# Patient Record
Sex: Female | Born: 1980 | Race: White | Hispanic: No | Marital: Married | Smoking: Never smoker
Health system: Southern US, Community
[De-identification: ages and names within clinical notes are randomized; demographics above are authoritative.]

## PROBLEM LIST (undated history)

## (undated) ENCOUNTER — Inpatient Hospital Stay (HOSPITAL_COMMUNITY): Payer: Self-pay

## (undated) DIAGNOSIS — F329 Major depressive disorder, single episode, unspecified: Secondary | ICD-10-CM

## (undated) DIAGNOSIS — B019 Varicella without complication: Secondary | ICD-10-CM

## (undated) DIAGNOSIS — F32A Depression, unspecified: Secondary | ICD-10-CM

## (undated) DIAGNOSIS — N12 Tubulo-interstitial nephritis, not specified as acute or chronic: Secondary | ICD-10-CM

## (undated) DIAGNOSIS — L309 Dermatitis, unspecified: Secondary | ICD-10-CM

## (undated) DIAGNOSIS — O139 Gestational [pregnancy-induced] hypertension without significant proteinuria, unspecified trimester: Secondary | ICD-10-CM

## (undated) HISTORY — DX: Major depressive disorder, single episode, unspecified: F32.9

## (undated) HISTORY — DX: Gestational (pregnancy-induced) hypertension without significant proteinuria, unspecified trimester: O13.9

## (undated) HISTORY — DX: Varicella without complication: B01.9

## (undated) HISTORY — PX: DILATION AND CURETTAGE OF UTERUS: SHX78

## (undated) HISTORY — DX: Tubulo-interstitial nephritis, not specified as acute or chronic: N12

## (undated) HISTORY — PX: TONSILLECTOMY: SUR1361

## (undated) HISTORY — DX: Dermatitis, unspecified: L30.9

## (undated) HISTORY — DX: Depression, unspecified: F32.A

---
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
## 2000-05-11 ENCOUNTER — Other Ambulatory Visit: Admission: RE | Admit: 2000-05-11 | Discharge: 2000-05-11 | Payer: Self-pay | Admitting: Obstetrics and Gynecology

## 2001-04-27 ENCOUNTER — Other Ambulatory Visit: Admission: RE | Admit: 2001-04-27 | Discharge: 2001-04-27 | Payer: Self-pay | Admitting: Obstetrics and Gynecology

## 2001-12-28 ENCOUNTER — Encounter: Payer: Self-pay | Admitting: Obstetrics and Gynecology

## 2001-12-28 ENCOUNTER — Ambulatory Visit (HOSPITAL_COMMUNITY): Admission: RE | Admit: 2001-12-28 | Discharge: 2001-12-28 | Payer: Self-pay | Admitting: Obstetrics and Gynecology

## 2002-01-28 ENCOUNTER — Inpatient Hospital Stay (HOSPITAL_COMMUNITY): Admission: AD | Admit: 2002-01-28 | Discharge: 2002-01-28 | Payer: Self-pay | Admitting: Obstetrics and Gynecology

## 2002-02-08 ENCOUNTER — Inpatient Hospital Stay (HOSPITAL_COMMUNITY): Admission: AD | Admit: 2002-02-08 | Discharge: 2002-02-10 | Payer: Self-pay | Admitting: Obstetrics and Gynecology

## 2002-02-16 ENCOUNTER — Encounter: Admission: RE | Admit: 2002-02-16 | Discharge: 2002-03-18 | Payer: Self-pay | Admitting: Obstetrics and Gynecology

## 2002-05-03 ENCOUNTER — Other Ambulatory Visit: Admission: RE | Admit: 2002-05-03 | Discharge: 2002-05-03 | Payer: Self-pay | Admitting: Obstetrics and Gynecology

## 2003-07-07 ENCOUNTER — Emergency Department (HOSPITAL_COMMUNITY): Admission: AC | Admit: 2003-07-07 | Discharge: 2003-07-07 | Payer: Self-pay

## 2003-11-21 ENCOUNTER — Other Ambulatory Visit: Admission: RE | Admit: 2003-11-21 | Discharge: 2003-11-21 | Payer: Self-pay | Admitting: Obstetrics and Gynecology

## 2004-09-26 ENCOUNTER — Inpatient Hospital Stay (HOSPITAL_COMMUNITY): Admission: AD | Admit: 2004-09-26 | Discharge: 2004-09-26 | Payer: Self-pay | Admitting: Obstetrics and Gynecology

## 2004-10-03 ENCOUNTER — Ambulatory Visit (HOSPITAL_COMMUNITY): Admission: RE | Admit: 2004-10-03 | Discharge: 2004-10-03 | Payer: Self-pay | Admitting: Obstetrics and Gynecology

## 2004-10-11 ENCOUNTER — Ambulatory Visit (HOSPITAL_COMMUNITY): Admission: RE | Admit: 2004-10-11 | Discharge: 2004-10-11 | Payer: Self-pay | Admitting: Obstetrics and Gynecology

## 2004-10-25 ENCOUNTER — Encounter (INDEPENDENT_AMBULATORY_CARE_PROVIDER_SITE_OTHER): Payer: Self-pay | Admitting: *Deleted

## 2004-10-25 ENCOUNTER — Ambulatory Visit (HOSPITAL_COMMUNITY): Admission: RE | Admit: 2004-10-25 | Discharge: 2004-10-25 | Payer: Self-pay | Admitting: Obstetrics and Gynecology

## 2005-03-14 ENCOUNTER — Inpatient Hospital Stay (HOSPITAL_COMMUNITY): Admission: AD | Admit: 2005-03-14 | Discharge: 2005-03-16 | Payer: Self-pay | Admitting: Obstetrics and Gynecology

## 2005-03-26 ENCOUNTER — Other Ambulatory Visit: Admission: RE | Admit: 2005-03-26 | Discharge: 2005-03-26 | Payer: Self-pay | Admitting: Obstetrics and Gynecology

## 2005-05-02 ENCOUNTER — Other Ambulatory Visit: Admission: RE | Admit: 2005-05-02 | Discharge: 2005-05-02 | Payer: Self-pay | Admitting: Obstetrics and Gynecology

## 2005-08-06 ENCOUNTER — Other Ambulatory Visit: Admission: RE | Admit: 2005-08-06 | Discharge: 2005-08-06 | Payer: Self-pay | Admitting: Obstetrics and Gynecology

## 2005-11-06 ENCOUNTER — Other Ambulatory Visit: Admission: RE | Admit: 2005-11-06 | Discharge: 2005-11-06 | Payer: Self-pay | Admitting: Obstetrics and Gynecology

## 2006-01-08 ENCOUNTER — Inpatient Hospital Stay (HOSPITAL_COMMUNITY): Admission: AD | Admit: 2006-01-08 | Discharge: 2006-01-08 | Payer: Self-pay | Admitting: Obstetrics and Gynecology

## 2006-01-15 ENCOUNTER — Inpatient Hospital Stay (HOSPITAL_COMMUNITY): Admission: AD | Admit: 2006-01-15 | Discharge: 2006-01-15 | Payer: Self-pay | Admitting: Obstetrics and Gynecology

## 2006-01-16 ENCOUNTER — Ambulatory Visit (HOSPITAL_COMMUNITY): Admission: RE | Admit: 2006-01-16 | Discharge: 2006-01-16 | Payer: Self-pay | Admitting: Obstetrics and Gynecology

## 2006-01-16 ENCOUNTER — Encounter (INDEPENDENT_AMBULATORY_CARE_PROVIDER_SITE_OTHER): Payer: Self-pay | Admitting: Specialist

## 2006-03-26 ENCOUNTER — Other Ambulatory Visit: Admission: RE | Admit: 2006-03-26 | Discharge: 2006-03-26 | Payer: Self-pay | Admitting: Obstetrics and Gynecology

## 2008-01-28 ENCOUNTER — Inpatient Hospital Stay (HOSPITAL_COMMUNITY): Admission: AD | Admit: 2008-01-28 | Discharge: 2008-01-28 | Payer: Self-pay | Admitting: Obstetrics and Gynecology

## 2008-05-01 ENCOUNTER — Inpatient Hospital Stay (HOSPITAL_COMMUNITY): Admission: AD | Admit: 2008-05-01 | Discharge: 2008-05-01 | Payer: Self-pay | Admitting: Obstetrics and Gynecology

## 2008-07-24 ENCOUNTER — Inpatient Hospital Stay (HOSPITAL_COMMUNITY): Admission: RE | Admit: 2008-07-24 | Discharge: 2008-07-26 | Payer: Self-pay | Admitting: Obstetrics and Gynecology

## 2010-12-31 NOTE — H&P (Signed)
NAMEYUKI, Chapman                 ACCOUNT NO.:  [id number]   MEDICAL RECORD NO.:  [id number]          PATIENT TYPE:  INP   LOCATION:  9168                          FACILITY:  [facility]   PHYSICIAN:  Crist Fat. Rivard, M.D. DATE OF BIRTH:  01/31/1981   DATE OF ADMISSION:  07/24/2008  DATE OF DISCHARGE:                              HISTORY & PHYSICAL   [name] is a 30 year old gravida 8, para 2-0-5-2 at 40 weeks who  presents for induction secondary to term with favorable cervix and  history of rapid labor.  Cervix has been 2-3 cm in the office.  Group B  strep culture has been negative.   Pregnancy has been remarkable for:  1. Five SABs since 2005.  Last viable pregnancy was in 2003.  2. Rh negative.  The patient receiving RhoGAM during this pregnancy.  3. History of PIH with first pregnancy.  4. History of pyelonephritis.   PRENATAL LABORATORIES:  Blood type is O negative, Rh antibody negative,  VDRL nonreactive, rubella titer positive, hepatitis B surface antigen  negative, HIV is nonreactive.  GC and Chlamydia cultures were negative  in April.  Hemoglobin upon entering the practice was 12.8.  It was 11.9  at 28 weeks.  Pap was done at 23 weeks showing ASCUS but no high-risk  HPV with the plan to repeat in 1 year.  First trimester screening was  normal.  Glucola was normal.  Group B strep culture was negative at 36  weeks.   HISTORY OF PRESENT PREGNANCY:  The patient entered care at approximately  7 weeks.  She had an ultrasound at that time for dating which gave an  [facility] July [facility], [facility].  She had had a report of some first trimester  spotting.  No evidence of this was seen.  Antibody screen was negative  from 04/24.  First trimester screen was normal.  The patient had an  ultrasound at 18 weeks showing normal growth.  Placenta was anterior.  Pap was done at 23 weeks showing ASCUS on Pap but no high-risk HPV with  a plan made to repeat in 1 year.  She was given RhoGAM at 27  weeks.  Glucola was 133.  RPR was nonreactive.  Hemoglobin at that time was  11.9.  She had a negative fetal echo with Dr. Elizebeth Brooking.  She declined H1  N1.  She had another ultrasound at 35 weeks showing growth at the 80th  percentile and fluid at the 75th percentile.  Group B strep culture was  done at that time and was negative.  The rest of her pregnancy was  essentially uncomplicated.  By 39 and 2, she was requesting induction  secondary to history of rapid labor.  Membranes were swept at that time,  and the patient reviewed the risk and benefits of elective induction  including failure of method, prolonged labor, need for cesarean section,  need for Pitocin augmentation.  She did wish to continue to proceed with  scheduled induction, in light of the favorableness of her cervix, and  she was scheduled for today.  OBSTETRICAL HISTORY:  In 2001, she had a vaginal birth of a female infant  weight, 8 pounds 9 ounces at 40 and 3/7 weeks.  She was in labor 16  hours.  She had epidural anesthesia.  She did have PIH with that  pregnancy but did not require medication.  In 2003, she had a vaginal  birth of a female infant, weight 8 pounds 4 ounces at 40 weeks'  gestation.  She was in labor 8 hours and had rapid progression to  delivery.  She had epidural anesthesia.  In that first pregnancy, she  had anemia, PIH.  She did receive RhoGAM with both of those pregnancies.  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  All of them were at 6-8 weeks of gestation,  and she received RhoGAM in the previous pregnancies.   MEDICAL HISTORY:  1. She has a previous abnormal Pap in 2006 and had a colposcopy.  All      Paps have been normal since that time.  2. She reports the usual childhood illnesses.  3. In 2001, she had anemia.  4. In 2006, she had a UTI and pyelonephritis.  5. She had a fractured ankle in 2006.   SURGICAL HISTORY:  1. D and E's x2  in 2006 and 2007.  2. Tonsillectomy at age 4.  3. Wisdom teeth at age 58.   ALLERGIES:  1. PENICILLIN which causes hives.  2. YASMIN which causes hives.   FAMILY HISTORY:  Paternal grandfather has hypertension.  Her mother has  anemia.  Her maternal grandmother has emphysema.  Her mother and  maternal grandmother are bipolar.   GENETIC HISTORY:  Remarkable for the patient's uncle's sister having a  hole in heart.  The patient's uncle has Down syndrome and also has a  cardiac defect.  The patient's sister also was born with a hole in the  heart.  Father of the baby's cousin had twins.   OTHER HOSPITALIZATIONS:  The patient's only other hospitalizations were  for childbirth, and in 2006 she was hospitalized for pyelonephritis.   SOCIAL HISTORY:  The patient is married to the father of baby.  He is  involved and supportive.  His name is Mertis Mosher.  The patient has 2  years of college.  She is employed in the [employer].  Her partner  is a stay-at-home dad.  She has been followed by the certified nurse  midwife service at [facility].  She denies any alcohol, drug or  tobacco use during this pregnancy.  She was a smoker in the past.   PHYSICAL EXAMINATION:  VITAL SIGNS:  Stable.  The patient is febrile.  HEENT:  Within normal limits.  LUNGS:  Breath sounds are clear.  HEART:  Regular rate and rhythm without murmur.  BREASTS:  Soft and nontender.  ABDOMEN:  Fundal height is approximately 40 cm.  Estimated fetal weight  is 8 to 8-1/2 pounds.  Uterine contractions are irregular and mild.  Cervix is 3 cm, 60%, vertex at a -2 station but well applied.  Membranes  are intact.  Fetal heart rate is reactive with no decelerations.  EXTREMITIES:  Deep tendon reflexes are 2+ without clonus.  There is  trace edema noted.   IMPRESSION:  1. Intrauterine pregnancy at 14 weeks.  2. Group B strep negative.  3. Requesting induction secondary to history of rapid labor.    PLAN:  1. Admit to birthing suite for consult with Dr. Estanislado Pandy as attending      physician.  2. Routine certified nurse midwife orders.  3. Risks and benefits of induction were reviewed with the patient and      family including failure of method, prolonged labor, need for      augmentation or cesarean section.  The patient and her husband seem      to understand these risks and wish to proceed.  4. The patient requests artificial rupture of membranes as initial      primary method of labor induction with augmentation with Pitocin if      no response to artificial rupture of membranes and no response also      to breast stimulation.  5. The patient desires pain medication at this time.  We will defer an      IV or saline lock per patient request.  She understands that if      emergent circumstances or need for pain medication arise, then an      IV will have to be started.      Renaldo Reel Emilee Hero, C.N.M.      Crist Fat Rivard, M.D.  Electronically Signed    VLL/MEDQ  D:  07/24/2008  T:  07/24/2008  Job:  [id number]

## 2011-01-03 NOTE — H&P (Signed)
Wendy Chapman, BETTES NO.:  [id number]   MEDICAL RECORD NO.:  [id number]          PATIENT TYPE:  MAT   LOCATION:  MATC                          [facility]ACILITY:  [facility]   PHYSICIAN:  Janine Limbo, M.D.DATE OF BIRTH:  07/12/1981   DATE OF ADMISSION:  01/15/2006  DATE OF DISCHARGE:                                HISTORY & PHYSICAL   Patient is scheduled for an outpatient day surgery tomorrow, January 16, 2006.  Dr. Estanislado Pandy will be performing the surgery.   This is a 30 year old gravida 5, para 2, 0-2-2 at [redacted] weeks gestation, who  presents with increased bleeding and probable spontaneous abortion.  Quantitative HCGs have been followed in the office and have been rising  inappropriately.  Patient has had bleeding for about a week, and ultrasound  done two days ago showed a four week gestational sac in the uterus.  She did  receive RhoGam on May 24th.  Bleeding increased today with some cramping.  Patient is requesting a D&C.   ALLERGIES:  PENICILLIN causes hives.   OB HISTORY:  Remarkable for two vaginal deliveries and two spontaneous  abortions.   PAST MEDICAL HISTORY:  Unremarkable except for history of HPV.   FAMILY HISTORY:  Noncontributory.   PAST SURGICAL HISTORY:  Remarkable for a D&E in 2006 and a tonsillectomy in  1994.   GENETIC HISTORY:  Negative.   SOCIAL HISTORY:  Patient is married.  Father of the baby is involved and  supportive.  She is a Scientist, product/process development.  She denies any tobacco, alcohol,  or drug use.   OBJECTIVE DATA:  VITAL SIGNS:  Stable.  Blood pressure 91/58, temperature  98.1.  HEENT:  Within normal limits.  NECK:  Thyroid not enlarged.  CHEST:  Clear to auscultation.  HEART:  Regular rate and rhythm.  ABDOMEN:  Gravid with a uterus at 4-5 weeks size.  PELVIC:  Speculum exam shows 1 cm dilated os with moderate blood in the  vaginal vault.  Pelvic shows the uterus 4-5 weeks in size, nontender, with  no masses in the adnexa.  EXTREMITIES:  Within normal limits.   Quantitative HCG today 665.  Forty-eight hours ago, it was 698.  CBC shows a  white blood cell count of 9.6, hemoglobin 13, platelets 281.   Ultrasound done tonight shows no intrauterine gestational sac and represents  a probable spontaneous abortion.   ASSESSMENT:  1.  Intrauterine pregnancy at seven weeks gestation.  2.  Spontaneous abortion with moderate bleeding.   PLAN:  Patient was informed of results, and options were discussed,  including expectant management versus dilatation and curettage.  Patient  strongly requests D&C to minimize bleeding and shorten her recovery time.  This was discussed with Dr. Stefano Gaul, and [facility] was scheduled for June 1 at 12  noon.  Patient will be n.p.o. past midnight and presents for surgery with  Dr. Estanislado Pandy tomorrow.      Marie L. Williams, C.N.M.      Janine Limbo, M.D.  Electronically Signed    MLW/MEDQ  D:  01/15/2006  T:  01/15/2006  Job:  [id number]

## 2011-01-03 NOTE — Op Note (Signed)
Wendy Chapman, Wendy Chapman                 ACCOUNT NO.:  [id number]   MEDICAL RECORD NO.:  [id number]          PATIENT TYPE:  AMB   LOCATION:  SDC                           [facility]  [facility]   PHYSICIAN:  Dois Davenport A. Rivard, M.D. DATE OF BIRTH:  1981-05-26   DATE OF PROCEDURE:  10/25/2004  DATE OF DISCHARGE:                                 OPERATIVE REPORT   PREOPERATIVE DIAGNOSIS:  Missed abortion.   POSTOPERATIVE DIAGNOSIS:  Missed abortion.   ANESTHESIA:  IV sedation and paracervical block.   PROCEDURE:  Dilatation and evacuation.   SURGEON:  Crist Fat. Rivard, M.D.   ESTIMATED BLOOD LOSS:  Minimal.   PROCEDURE:  After being informed of the planned procedure with possible  complications, including bleeding, infection and uterine perforation,  informed consent was obtained.  The patient is taken to OR #8 and given IV  sedation.  She is placed in the lithotomy position, prepped and draped in a  sterile fashion, and her bladder is emptied with an in-and-out catheter.   A weighted speculum is inserted and the anterior lip of the cervix is  grasped with a tenaculum forceps.  The uterus is sounded at 10 cm, and the  cervix is easily dilated using Hegar dilator until #31.  Using a #9 curved  cannula, we then proceed with evacuation of the uterus, which retrieves  normal-appearing products of conception.   After evacuation, a sharp curette is used to assess the uterine cavity,  which is felt to be empty of tissue.   Instruments are then removed.  Instrument and sponge count is complete x2.  Estimated blood loss is minimal.  The procedure is well-tolerated by the  patient, who is taken to the recovery room in a well and stable condition.      SAR/MEDQ  D:  10/25/2004  T:  10/25/2004  Job:  [id number]

## 2011-01-03 NOTE — Discharge Summary (Signed)
NAMECYRAH, MCLAMB                 ACCOUNT NO.:  [id number]   MEDICAL RECORD NO.:  [id number]          PATIENT TYPE:  INP   LOCATION:  9307                          [facility]   PHYSICIAN:  Janine Limbo, M.D.DATE OF BIRTH:  11/14/1980   DATE OF ADMISSION:  03/14/2005  DATE OF DISCHARGE:  03/16/2005                                 DISCHARGE SUMMARY   DISCHARGE DIAGNOSIS:  Pyelonephritis.   PROCEDURES THIS ADMISSION:  None.   HISTORY OF PRESENT ILLNESS:  Ms. Margo Aye is a 30 year old female, para 2-0-1-2,  who presents complaining of low back pain for 3 days, fever, malaise, and  chills. Two weeks ago, she was treated for a urinary tract infection with  Macrodantin. She also had negative hepatitis B, negative HIV, negative RPR,  and negative herpes simplex virus test.   Please see history of present illness for details.   PHYSICAL EXAMINATION:  VITAL SIGNS:  Temperature is 100.2.  HEENT:  The patient appeared quite ill with shaking chills.  CHEST:  Clear.  HEART:  Regular rate and rhythm.  ABDOMEN:  Nontender.  BACK:  Right CVA tenderness.  PELVIC:  Within normal limits.   HOSPITAL COURSE:  The the patient reports being allergic to penicillin.  Therefore, she was started on IV gentamicin and ciprofloxacin by mouth. She  did have a maximum temperature in the hospital of 103.1. Laboratory testing  during the hospitalization included a negative pregnancy test, normal  chemistries, a hemoglobin of 13.3, white blood cell count of 10,200, blood  cultures that were negative to date, and a urine culture that showed greater  than 100,000 colonies of E-coli. The patient quickly felt better during her  hospitalization. By March 16, 2005, she was tolerating a regular diet and her  pain management was adequate with medication by mouth. She had been afebrile  for greater than 36 hours. The decision was made to discharge the patient to  home on p.o. medications.   DISCHARGE  MEDICATIONS:  1.  Vicodin one or two p.o. q.4 h p.r.n. pain.  2.  Ciprofloxacin 500 milligrams b.i.d. for 10 days.  3.  Ibuprofen 600 milligrams every 6 hours as needed for mild to moderate      pain.   DISCHARGE INSTRUCTIONS:  The patient will return to see Dr. Stefano Gaul in 2  weeks for repeat exam or sooner if necessary.       AVS/MEDQ  D:  03/16/2005  T:  03/17/2005  Job:  [id number]

## 2011-01-03 NOTE — Op Note (Signed)
Wendy Chapman, Wendy Chapman                 ACCOUNT NO.:  [id number]   MEDICAL RECORD NO.:  [id number]          PATIENT TYPE:  AMB   LOCATION:  SDC                           [facility]   PHYSICIAN:  Dois Davenport A. Rivard, M.D. DATE OF BIRTH:  09-08-1980   DATE OF PROCEDURE:  01/16/2006  DATE OF DISCHARGE:                                 OPERATIVE REPORT   PREOPERATIVE DIAGNOSIS:  Missed abortion.   POSTOPERATIVE DIAGNOSIS:  Missed abortion.   ANESTHESIA:  General and paracervical block.   PROCEDURE:  Dilation and curettage.   SURGEON:  Dr. Estanislado Pandy.   ESTIMATED BLOOD LOSS:  Minimal.   PROCEDURE:  After being informed of the planned procedure with possible  complications including bleeding, infection and injury to uterus, informed  consent is obtained.  The patient is taken to OR #4, given IV sedation and  placed in the lithotomy position.  She is prepped and draped in a sterile  fashion, and the bladder is emptied with an in-and-out Foley catheter.  GYN  exam reveals a patent cervix, a normal size anteverted uterus, two normal  adnexa.  A weighted speculum is inserted.  Anterior lip of the cervix is  grasped with a tenaculum forceps, and we proceed with a paracervical block  with 10 mL of Nesacaine 1% in the usual fashion.  The uterus is then sounded  at 7.5 cm, and the cervix easily allows a #33 Hegar dilator.  Using a sharp  curette, we curette the endometrial cavity which removes a large amount of  decidual tissue, but no products of conception are identified.  The patient  did come in with having passed some tissue which is examined for presence of  products of conception and appears to be fetal membranes after floating in  normal saline.   Instruments are removed.  Instruments and sponge count is complete x2.  Estimated blood loss is minimal.  The procedure is very well tolerated by  the patient who is taken to recovery room in a well and stable condition.  The patient will  follow-up in one week in the office for quantitative HCG,  and she is instructed to call if experiencing severe pain or severe  bleeding.  She is informed that we do not have confirmation of intrauterine  pregnancy, although clinically this appears to be the case, we still have  the possibility of an ectopic pregnancy, and she fully understands this.      Crist Fat Rivard, M.D.  Electronically Signed     SAR/MEDQ  D:  01/16/2006  T:  01/16/2006  Job:  [id number]

## 2011-01-03 NOTE — H&P (Signed)
[facility]  Patient:    Wendy Chapman, Wendy Chapman Visit Number: [id number] MRN: [id number]          Service Type: OBS Location: 910B [id number] 01 Attending Physician:  Jaymes Graff A Dictated by:   Nigel Bridgeman, C.N.M. Admit Date:  02/08/2002                           History and Physical  HISTORY OF PRESENT ILLNESS:   The patient is a 30 year old gravida 2, para 1-0-0-1 at [redacted] weeks gestation, who presents with uterine contractions every 3 minutes for the last several hours.  Cervix has been 4 cm in the office. She denied any bleeding or leaking and reports positive fetal movement.  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  #2 - History of irregular cycles.  #3 - First trimester spotting.  PRENATAL LABORATORY DATA:     Blood type is O-negative, Rh-antibody negative. VDRL nonreactive.  Rubella titer positive.  Hepatitis B surface antigen negative.  HIV nonreactive.  GC and Chlamydia cultures were negative.  Pap was normal.   Glucose challenge was normal.  Hemoglobin upon entry into practice was 13.2; it was 13.3 at 26 weeks.  AFP was declined.  Group B strep culture was negative at 36 weeks.  EDC of February 08, 2002 was established by last menstrual period and was in agreement with ultrasound at approximately 18 weeks.  HISTORY OF PRESENT PREGNANCY:  The patient entered care at approximately 9 weeks.  She declined RhoGAM secondary to status of Jehovahs Witness.  She had oral HSV noted at approximately 10 weeks, was given Zovirax ointment.  She had some cramping at 17 weeks which was treated with Motrin.  She had an ultrasound at 18 weeks which showed good growth and fluid.  She had some cramping at 33 weeks but cervix was closed.  Had an ultrasound at 33 weeks secondary to a family history of congenital heart defect, with no abnormal findings  noted.  She had another ultrasound at approximately 34 weeks with no cardiac anomalies noted, growth at the 75th to the 90th percentile.  The rest of her pregnancy was uncomplicated.  OBSTETRICAL HISTORY:          In January of 2001, she had a vaginal birth of a female infant, weight 8 pounds 9 ounces, at [redacted] weeks gestation; she was in labor 8 hours; she had epidural anesthesia.  She had elevated blood pressure during her labor.  She also declined RhoGAM during that pregnancy.  MEDICAL HISTORY:              She was on oral contraceptives and Lunelle in the past.  She has had one yeast infection in the past.  She reports the usual childhood illnesses.  PAST SURGICAL HISTORY:        Surgical history includes a tonsillectomy and adenoidectomy at age 75 and wisdom teeth removed at age 60.  Her only other hospitalization was for childbirth.  ALLERGIES:                    PENICILLIN causes a rash.  FAMILY HISTORY:               The patients maternal uncle had a heart murmur. Her sister had anemia.  Maternal grandmother had emphysema.  Maternal uncle had  some type of diabetes.  Maternal uncle had also some type of cancer. Maternal aunt and mother and maternal grandmother have depression and are bipolar.  Mother and maternal grandmother are victims of physical abuse. Grandmother is a smoker.  Mother was previously addicted to pain medications but is now in recovery.  GENETIC HISTORY:              Genetic history is remarkable for the father of the baby having an uncle with Downs syndrome.  The patients sister was born with a hole in her heart but did not require any treatment.  Her grandmother had a stillborn or early neonatal death.  SOCIAL HISTORY:               The patient is married to the father of the baby; he is involved and supportive; his name is Kellie Moor.  The patients is Caucasian.  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  Her husband is  high-school-educated; he is employed with the [employer].  She has been followed by the certified nurse midwife service at [facility].  She denies any alcohol, drug or tobacco use during this pregnancy.  PHYSICAL EXAMINATION:  VITAL SIGNS:                  Vital signs are stable.  The patient is afebrile.  HEENT:                        Within normal limits.  LUNGS:                        Bilateral breath sounds are clear.  HEART:                        Regular rate and rhythm without murmur.  BREASTS:                      Soft and nontender.  ABDOMEN:                      Fundal height is approximately 39 cm.  Estimated fetal weight is 8 to 8-1/2 pounds.  Uterine contractions are every 3 minutes, moderate quality.  Fetal heart rate is reassuring and reactive with no decelerations.  PELVIC:                       Cervical exam 4 to 5 cm, 75%, vertex at a -1 station with vertex well-applied.  EXTREMITIES:                  Deep tendon reflexes are 2+ without clonus. There is a trace edema noted.  IMPRESSION: 1. Intrauterine pregnancy at 40 weeks. 2. Early active labor. 3. Rh-negative but declines RhoGAM.  PLAN: 1. Admit to birthing suite per consult with Dr. Pierre Bali. Dillard as attending    physician. 2. Routine certified nurse midwife orders. 3. The patient declines epidural at present and requests deferral of IV at    this time. 4. Anticipate normal spontaneous vaginal birth. Dictated by:   Nigel Bridgeman, C.N.M. Attending Physician:  Michael Litter DD:  02/09/02 TD:  02/09/02 Job: [id number]

## 2011-01-03 NOTE — H&P (Signed)
Wendy Chapman, Wendy Chapman                 ACCOUNT NO.:  [id number]   MEDICAL RECORD NO.:  [id number]          PATIENT TYPE:  INP   LOCATION:  9307                          FACILITY:  [facility]   PHYSICIAN:  Janine Limbo, M.D.DATE OF BIRTH:  January 24, 1981   DATE OF ADMISSION:  03/14/2005  DATE OF DISCHARGE:                                HISTORY & PHYSICAL   HISTORY OF PRESENT ILLNESS:  [name] is a 30 year old female who presents  to the office complaining of low-back pain x3 days with temperature  elevation to 102+ at home without Tylenol. She complains of slight dysuria  as well as right flank pain, no frequency or urgency, some nausea but no  vomiting. She was seen at our office on February 24, 2005 and was treated for  urinary tract infection. She was given Macrobid which she completed her  course and urine culture showed greater than 100,000 colonies of E. coli  sensitive to Macrobid. The patient's symptoms have returned, thus causing  her to come back to the office for evaluation. The patient had negative  gonorrhea and chlamydia cultures on February 24, 2005. She also had negative  hepatitis B, HIV, RPR, and HSV-2 blood work on that same day.   PAST MEDICAL HISTORY:  She is allergic to PENICILLIN and YASMIN. The patient  has been taking NuvaRing for contraception.   OBSTETRICAL HISTORY:  She is a gravida 3 para 2. She had a vaginal delivery  in January 2001 as well as in June 2003, and then she had a missed AB  requiring D&E March 2006.   SOCIAL HISTORY:  She is separated at this time.   FAMILY MEDICAL HISTORY:  Noncontributory.   OBJECTIVE DATA:  VITAL SIGNS:  Temperature 100.2, blood pressure is 124/84,  other vital signs are stable.  HEENT:  Grossly within normal limits.  CHEST:  Clear to auscultation.  HEART:  Regular rate and rhythm.  ABDOMEN:  Nontender. No masses or organomegaly.  PELVIC:  Within normal limits.  EXTREMITIES:  Within normal limits.  BACK:  Shows positive right  CVA tenderness.  LABORATORY:  Her urinalysis is remarkable for trace leukocytes, positive  nitrites, 1+ protein, 3+ blood. Pregnancy test is negative.  EXTREMITIES:  Within normal limits.   ASSESSMENT:  Pyelonephritis.   PLAN:  1.  Admit to women's unit per consult with Dr. Stefano Gaul.  2.  Plan IV antibiotics and fluids for treatment.       KS/MEDQ  D:  03/14/2005  T:  03/14/2005  Job:  [id number]

## 2011-05-15 LAB — URINALYSIS, ROUTINE W REFLEX MICROSCOPIC
Bilirubin Urine: NEGATIVE
Glucose, UA: NEGATIVE
Nitrite: NEGATIVE
Urobilinogen, UA: 0.2
pH: 6

## 2011-05-15 LAB — WET PREP, GENITAL
Clue Cells Wet Prep HPF POC: NONE SEEN
Trich, Wet Prep: NONE SEEN
Yeast Wet Prep HPF POC: NONE SEEN

## 2011-05-21 LAB — RH IMMUNE GLOBULIN WORKUP (NOT WOMEN'S HOSP): ABO/RH(D): O NEG

## 2011-05-23 LAB — CBC
HCT: 30.1 % — ABNORMAL LOW (ref 36.0–46.0)
Hemoglobin: 10.3 g/dL — ABNORMAL LOW (ref 12.0–15.0)
MCHC: 34.3 g/dL (ref 30.0–36.0)
MCHC: 34.3 g/dL (ref 30.0–36.0)
Platelets: 280 10*3/uL (ref 150–400)
RBC: 3.44 MIL/uL — ABNORMAL LOW (ref 3.87–5.11)
RDW: 13.2 % (ref 11.5–15.5)
RDW: 13.5 % (ref 11.5–15.5)
WBC: 10.7 10*3/uL — ABNORMAL HIGH (ref 4.0–10.5)
WBC: 17.2 10*3/uL — ABNORMAL HIGH (ref 4.0–10.5)

## 2011-05-23 LAB — RH IMMUNE GLOB WKUP(>/=20WKS)(NOT WOMEN'S HOSP)

## 2013-08-18 NOTE — L&D Delivery Note (Addendum)
Delivery Note At 3:55 PM a viable female, "Wendy Chapman", was delivered via Vaginal, Spontaneous Delivery (Presentation: Right Occiput Anterior).  APGAR: 8, 9; weight .   Placenta status: Intact, Spontaneous, marginal insertion of cord.  Cord: 3 vessels with the following complications: CAN x 1, loose.  Cord pH: NA  Anesthesia: Epidural  Episiotomy: None Lacerations: 2nd degree;Perineal Suture Repair: 3.0 vicryl Est. Blood Loss (mL): 200  Mom to postpartum.  Baby to Couplet care / Skin to Skin. Family plans inpatient circumcision. Patient desires BTL--will maintain IV access and epidural cath.  Last ate at 11am today. Dr. Stefano Gaul notified, will determine time for BTL. R&B of tubal reviewed, including failure of method, risk of anesthesia, bleeding, infection, and damage to other organs. Patient still wishes to proceed with BTL.  Nigel Bridgeman 05/10/2014, 4:39 PM

## 2013-09-08 LAB — OB RESULTS CONSOLE RUBELLA ANTIBODY, IGM: Rubella: IMMUNE

## 2013-09-08 LAB — OB RESULTS CONSOLE HEPATITIS B SURFACE ANTIGEN: HEP B S AG: NEGATIVE

## 2013-09-08 LAB — OB RESULTS CONSOLE GC/CHLAMYDIA
Chlamydia: NEGATIVE
Gonorrhea: NEGATIVE

## 2013-09-08 LAB — OB RESULTS CONSOLE HIV ANTIBODY (ROUTINE TESTING): HIV: NONREACTIVE

## 2013-09-08 LAB — OB RESULTS CONSOLE RPR: RPR: NONREACTIVE

## 2014-04-07 LAB — OB RESULTS CONSOLE GBS: GBS: POSITIVE

## 2014-05-03 ENCOUNTER — Inpatient Hospital Stay (HOSPITAL_COMMUNITY)
Admission: AD | Admit: 2014-05-03 | Discharge: 2014-05-04 | DRG: 781 | Disposition: A | Discharge status: Home / Self Care | Payer: Managed Care, Other (non HMO) | Source: Ambulatory Visit | Attending: Obstetrics and Gynecology | Admitting: Obstetrics and Gynecology

## 2014-05-03 ENCOUNTER — Emergency Department (HOSPITAL_COMMUNITY): Payer: Managed Care, Other (non HMO)

## 2014-05-03 ENCOUNTER — Encounter (HOSPITAL_COMMUNITY): Payer: Self-pay | Admitting: Emergency Medicine

## 2014-05-03 DIAGNOSIS — S20219A Contusion of unspecified front wall of thorax, initial encounter: Secondary | ICD-10-CM | POA: Diagnosis present

## 2014-05-03 DIAGNOSIS — O4700 False labor before 37 completed weeks of gestation, unspecified trimester: Secondary | ICD-10-CM | POA: Diagnosis present

## 2014-05-03 DIAGNOSIS — O26899 Other specified pregnancy related conditions, unspecified trimester: Secondary | ICD-10-CM

## 2014-05-03 DIAGNOSIS — M542 Cervicalgia: Secondary | ICD-10-CM | POA: Diagnosis present

## 2014-05-03 DIAGNOSIS — Z22330 Carrier of Group B streptococcus: Secondary | ICD-10-CM

## 2014-05-03 DIAGNOSIS — O99891 Other specified diseases and conditions complicating pregnancy: Secondary | ICD-10-CM | POA: Diagnosis present

## 2014-05-03 DIAGNOSIS — IMO0002 Reserved for concepts with insufficient information to code with codable children: Secondary | ICD-10-CM | POA: Diagnosis present

## 2014-05-03 DIAGNOSIS — IMO0001 Reserved for inherently not codable concepts without codable children: Secondary | ICD-10-CM | POA: Diagnosis present

## 2014-05-03 DIAGNOSIS — S139XXA Sprain of joints and ligaments of unspecified parts of neck, initial encounter: Secondary | ICD-10-CM | POA: Diagnosis present

## 2014-05-03 DIAGNOSIS — Z880 Allergy status to penicillin: Secondary | ICD-10-CM | POA: Diagnosis not present

## 2014-05-03 DIAGNOSIS — Z6791 Unspecified blood type, Rh negative: Secondary | ICD-10-CM | POA: Diagnosis present

## 2014-05-03 DIAGNOSIS — Z8759 Personal history of other complications of pregnancy, childbirth and the puerperium: Secondary | ICD-10-CM

## 2014-05-03 DIAGNOSIS — S161XXA Strain of muscle, fascia and tendon at neck level, initial encounter: Secondary | ICD-10-CM

## 2014-05-03 DIAGNOSIS — Y92410 Unspecified street and highway as the place of occurrence of the external cause: Secondary | ICD-10-CM

## 2014-05-03 DIAGNOSIS — O039 Complete or unspecified spontaneous abortion without complication: Secondary | ICD-10-CM | POA: Diagnosis not present

## 2014-05-03 DIAGNOSIS — O360990 Maternal care for other rhesus isoimmunization, unspecified trimester, not applicable or unspecified: Secondary | ICD-10-CM | POA: Diagnosis present

## 2014-05-03 DIAGNOSIS — Z87448 Personal history of other diseases of urinary system: Secondary | ICD-10-CM | POA: Diagnosis not present

## 2014-05-03 DIAGNOSIS — S20211A Contusion of right front wall of thorax, initial encounter: Secondary | ICD-10-CM

## 2014-05-03 DIAGNOSIS — O9989 Other specified diseases and conditions complicating pregnancy, childbirth and the puerperium: Principal | ICD-10-CM

## 2014-05-03 LAB — TYPE AND SCREEN
ABO/RH(D): O NEG
ANTIBODY SCREEN: NEGATIVE

## 2014-05-03 LAB — CBC WITH DIFFERENTIAL/PLATELET
BASOS ABS: 0 10*3/uL (ref 0.0–0.1)
BASOS PCT: 0 % (ref 0–1)
Eosinophils Absolute: 0.1 10*3/uL (ref 0.0–0.7)
Eosinophils Relative: 1 % (ref 0–5)
HEMATOCRIT: 31.9 % — AB (ref 36.0–46.0)
HEMOGLOBIN: 10.4 g/dL — AB (ref 12.0–15.0)
Lymphocytes Relative: 23 % (ref 12–46)
Lymphs Abs: 1.9 10*3/uL (ref 0.7–4.0)
MCH: 27.6 pg (ref 26.0–34.0)
MCHC: 32.6 g/dL (ref 30.0–36.0)
MCV: 84.6 fL (ref 78.0–100.0)
Monocytes Absolute: 0.5 10*3/uL (ref 0.1–1.0)
Monocytes Relative: 6 % (ref 3–12)
Neutro Abs: 6 10*3/uL (ref 1.7–7.7)
Neutrophils Relative %: 70 % (ref 43–77)
Platelets: 315 10*3/uL (ref 150–400)
RBC: 3.77 MIL/uL — AB (ref 3.87–5.11)
RDW: 14 % (ref 11.5–15.5)
WBC: 8.5 10*3/uL (ref 4.0–10.5)

## 2014-05-03 LAB — ABO/RH
ABO/RH(D): O NEG
ANTIBODY SCREEN: POSITIVE
DAT, IGG: NEGATIVE

## 2014-05-03 LAB — URINALYSIS, ROUTINE W REFLEX MICROSCOPIC
Bilirubin Urine: NEGATIVE
Glucose, UA: NEGATIVE mg/dL
KETONES UR: NEGATIVE mg/dL
LEUKOCYTES UA: NEGATIVE
NITRITE: NEGATIVE
PH: 6.5 (ref 5.0–8.0)
Protein, ur: NEGATIVE mg/dL
Specific Gravity, Urine: <1.005 — ABNORMAL LOW (ref 1.005–1.030)
UROBILINOGEN UA: 0.2 mg/dL (ref 0.0–1.0)

## 2014-05-03 LAB — URINE MICROSCOPIC-ADD ON

## 2014-05-03 MED ORDER — LACTATED RINGERS IV BOLUS (SEPSIS)
1000.0000 mL | Freq: Once | INTRAVENOUS | Status: AC
  Administered 2014-05-03: 1000 mL via INTRAVENOUS

## 2014-05-03 MED ORDER — LIDOCAINE HCL (PF) 1 % IJ SOLN: 30.0000 mL | INTRAMUSCULAR | Status: DC | PRN

## 2014-05-03 MED ORDER — FLEET ENEMA 7-19 GM/118ML RE ENEM: 1.0000 | ENEMA | RECTAL | Status: DC | PRN

## 2014-05-03 MED ORDER — LACTATED RINGERS IV SOLN: 500.0000 mL | INTRAVENOUS | Status: DC | PRN

## 2014-05-03 MED ORDER — CEFAZOLIN SODIUM-DEXTROSE 2-3 GM-% IV SOLR
2.0000 g | Freq: Once | INTRAVENOUS | Status: AC
  Administered 2014-05-03: 2 g via INTRAVENOUS
  Filled 2014-05-03: qty 50

## 2014-05-03 MED ORDER — CEFAZOLIN SODIUM 1-5 GM-% IV SOLN
1.0000 g | Freq: Three times a day (TID) | INTRAVENOUS | Status: DC
  Administered 2014-05-04 (×2): 1 g via INTRAVENOUS
  Filled 2014-05-03 (×4): qty 50

## 2014-05-03 MED ORDER — OXYCODONE-ACETAMINOPHEN 5-325 MG PO TABS: 1.0000 | ORAL_TABLET | ORAL | Status: DC | PRN

## 2014-05-03 MED ORDER — ONDANSETRON HCL 4 MG/2ML IJ SOLN: 4.0000 mg | Freq: Four times a day (QID) | INTRAMUSCULAR | Status: DC | PRN

## 2014-05-03 MED ORDER — OXYTOCIN BOLUS FROM INFUSION: 500.0000 mL | INTRAVENOUS | Status: DC

## 2014-05-03 MED ORDER — ACETAMINOPHEN 325 MG PO TABS
650.0000 mg | ORAL_TABLET | ORAL | Status: DC | PRN
  Administered 2014-05-04: 650 mg via ORAL
  Filled 2014-05-03: qty 2

## 2014-05-03 MED ORDER — OXYCODONE-ACETAMINOPHEN 5-325 MG PO TABS: 2.0000 | ORAL_TABLET | ORAL | Status: DC | PRN

## 2014-05-03 MED ORDER — BUTORPHANOL TARTRATE 1 MG/ML IJ SOLN: 1.0000 mg | INTRAMUSCULAR | Status: DC | PRN

## 2014-05-03 MED ORDER — CITRIC ACID-SODIUM CITRATE 334-500 MG/5ML PO SOLN: 30.0000 mL | ORAL | Status: DC | PRN

## 2014-05-03 MED ORDER — OXYTOCIN 40 UNITS IN LACTATED RINGERS INFUSION - SIMPLE MED: 62.5000 mL/h | INTRAVENOUS | Status: DC

## 2014-05-03 MED ORDER — SODIUM CHLORIDE 0.9 % IV BOLUS (SEPSIS): 1000.0000 mL | Freq: Once | INTRAVENOUS | Status: DC

## 2014-05-03 MED ORDER — ZOLPIDEM TARTRATE 5 MG PO TABS: 5.0000 mg | ORAL_TABLET | Freq: Every evening | ORAL | Status: DC | PRN

## 2014-05-03 MED ORDER — LACTATED RINGERS IV SOLN
INTRAVENOUS | Status: DC
  Administered 2014-05-03: 19:00:00 via INTRAVENOUS

## 2014-05-03 NOTE — ED Notes (Addendum)
Pt transported with [facility] to xray scan
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
## 2014-05-03 NOTE — ED Notes (Signed)
Patient having contractions approximately every 90secs
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
## 2014-05-03 NOTE — Progress Notes (Signed)
This note also relates to the following rows which could not be included: Pulse Rate - Cannot attach notes to unvalidated device data SpO2 - Cannot attach notes to unvalidated device data   EFM switched to portable for transfer to X-ray and CT, FHR not stored in OBIX while out of ED

## 2014-05-03 NOTE — ED Notes (Signed)
Dr. James at bedside  
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
## 2014-05-03 NOTE — Progress Notes (Signed)
This note also relates to the following rows which could not be included: Pulse Rate - Cannot attach notes to unvalidated device data SpO2 - Cannot attach notes to unvalidated device data   [facility] here to transfer patient to [facility], Labor and Delivery Rm 166.

## 2014-05-03 NOTE — ED Notes (Signed)
Pt transported to CT by this rn and OB RR

## 2014-05-03 NOTE — Progress Notes (Signed)
In good spirits. Asked if IVFs could be discontinued and only saline lock maintained. Cat 1 FHRT, + u/c's. Ok to d/c IVFs at this time. Pt. Understands that fluids will need to be giving when she receives abx.   Sherre Scarlet, CNM 05/03/14 @ 10:19 PM
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
## 2014-05-03 NOTE — ED Notes (Signed)
Pt transferred to [facility] via [facility] to labor and delievery Rm 166. Shary Decamp spoke to Raney at L&D for report

## 2014-05-04 ENCOUNTER — Inpatient Hospital Stay (HOSPITAL_COMMUNITY): Payer: Managed Care, Other (non HMO)

## 2014-05-04 LAB — RPR

## 2014-05-04 MED ORDER — CYCLOBENZAPRINE HCL 10 MG PO TABS
10.0000 mg | ORAL_TABLET | Freq: Three times a day (TID) | ORAL | Status: DC
  Administered 2014-05-04: 10 mg via ORAL
  Filled 2014-05-04 (×4): qty 1

## 2014-05-04 MED ORDER — CYCLOBENZAPRINE HCL 10 MG PO TABS: 10.0000 mg | ORAL_TABLET | Freq: Three times a day (TID) | ORAL | Status: DC
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
## 2014-05-08 NOTE — Progress Notes (Signed)
Post ur review completed per request.

## 2014-05-10 ENCOUNTER — Encounter (HOSPITAL_COMMUNITY): Payer: Managed Care, Other (non HMO) | Admitting: Anesthesiology

## 2014-05-10 ENCOUNTER — Other Ambulatory Visit: Payer: Self-pay | Admitting: Obstetrics and Gynecology

## 2014-05-10 ENCOUNTER — Inpatient Hospital Stay (HOSPITAL_COMMUNITY): Payer: Managed Care, Other (non HMO) | Admitting: Anesthesiology

## 2014-05-10 ENCOUNTER — Encounter (HOSPITAL_COMMUNITY): Payer: Self-pay | Admitting: *Deleted

## 2014-05-10 ENCOUNTER — Inpatient Hospital Stay (HOSPITAL_COMMUNITY)
Admission: AD | Admit: 2014-05-10 | Discharge: 2014-05-12 | DRG: 767 | Disposition: A | Discharge status: Home / Self Care | Payer: Managed Care, Other (non HMO) | Source: Ambulatory Visit | Attending: Obstetrics and Gynecology | Admitting: Obstetrics and Gynecology

## 2014-05-10 ENCOUNTER — Encounter (HOSPITAL_COMMUNITY)
Admission: AD | Disposition: A | Discharge status: Home / Self Care | Payer: Self-pay | Source: Ambulatory Visit | Attending: Obstetrics and Gynecology

## 2014-05-10 DIAGNOSIS — Z302 Encounter for sterilization: Secondary | ICD-10-CM | POA: Diagnosis not present

## 2014-05-10 DIAGNOSIS — O360990 Maternal care for other rhesus isoimmunization, unspecified trimester, not applicable or unspecified: Secondary | ICD-10-CM | POA: Diagnosis present

## 2014-05-10 DIAGNOSIS — O479 False labor, unspecified: Secondary | ICD-10-CM | POA: Diagnosis present

## 2014-05-10 DIAGNOSIS — O9989 Other specified diseases and conditions complicating pregnancy, childbirth and the puerperium: Secondary | ICD-10-CM

## 2014-05-10 DIAGNOSIS — Z22330 Carrier of Group B streptococcus: Secondary | ICD-10-CM

## 2014-05-10 DIAGNOSIS — O99892 Other specified diseases and conditions complicating childbirth: Secondary | ICD-10-CM | POA: Diagnosis present

## 2014-05-10 DIAGNOSIS — Z823 Family history of stroke: Secondary | ICD-10-CM

## 2014-05-10 DIAGNOSIS — Z880 Allergy status to penicillin: Secondary | ICD-10-CM | POA: Diagnosis not present

## 2014-05-10 HISTORY — PX: TUBAL LIGATION: SHX77

## 2014-05-10 LAB — CBC
HEMATOCRIT: 35.4 % — AB (ref 36.0–46.0)
HEMOGLOBIN: 11.5 g/dL — AB (ref 12.0–15.0)
MCH: 27.6 pg (ref 26.0–34.0)
MCHC: 32.5 g/dL (ref 30.0–36.0)
MCV: 85.1 fL (ref 78.0–100.0)
Platelets: 309 10*3/uL (ref 150–400)
RBC: 4.16 MIL/uL (ref 3.87–5.11)
RDW: 14.6 % (ref 11.5–15.5)
WBC: 11.4 10*3/uL — ABNORMAL HIGH (ref 4.0–10.5)

## 2014-05-10 LAB — RPR

## 2014-05-10 LAB — TYPE AND SCREEN
ABO/RH(D): O NEG
Antibody Screen: NEGATIVE

## 2014-05-10 SURGERY — LIGATION, FALLOPIAN TUBE, POSTPARTUM
Anesthesia: Epidural | Site: Abdomen | Laterality: Bilateral

## 2014-05-10 MED ORDER — BUPIVACAINE HCL (PF) 0.5 % IJ SOLN
INTRAMUSCULAR | Status: AC
  Filled 2014-05-10: qty 30

## 2014-05-10 MED ORDER — ACETAMINOPHEN 325 MG PO TABS: 650.0000 mg | ORAL_TABLET | ORAL | Status: DC | PRN

## 2014-05-10 MED ORDER — MIDAZOLAM HCL 2 MG/2ML IJ SOLN
INTRAMUSCULAR | Status: AC
  Filled 2014-05-10: qty 2

## 2014-05-10 MED ORDER — OXYCODONE-ACETAMINOPHEN 5-325 MG PO TABS
1.0000 | ORAL_TABLET | ORAL | Status: DC | PRN
  Administered 2014-05-10 – 2014-05-12 (×8): 1 via ORAL
  Filled 2014-05-10 (×8): qty 1

## 2014-05-10 MED ORDER — OXYTOCIN 40 UNITS IN LACTATED RINGERS INFUSION - SIMPLE MED
INTRAVENOUS | Status: DC | PRN
  Administered 2014-05-10: 62.5 mL/h via INTRAVENOUS

## 2014-05-10 MED ORDER — SENNOSIDES-DOCUSATE SODIUM 8.6-50 MG PO TABS
2.0000 | ORAL_TABLET | ORAL | Status: DC
Start: 2014-05-11 — End: 2014-05-12
  Administered 2014-05-11 (×2): 2 via ORAL
  Filled 2014-05-10 (×2): qty 2

## 2014-05-10 MED ORDER — ONDANSETRON HCL 4 MG/2ML IJ SOLN
INTRAMUSCULAR | Status: AC
  Filled 2014-05-10: qty 2

## 2014-05-10 MED ORDER — LANOLIN HYDROUS EX OINT
TOPICAL_OINTMENT | CUTANEOUS | Status: DC | PRN
Start: 2014-05-10 — End: 2014-05-12

## 2014-05-10 MED ORDER — BUTORPHANOL TARTRATE 1 MG/ML IJ SOLN: 1.0000 mg | INTRAMUSCULAR | Status: DC | PRN

## 2014-05-10 MED ORDER — EPHEDRINE 5 MG/ML INJ: 10.0000 mg | INTRAVENOUS | Status: DC | PRN

## 2014-05-10 MED ORDER — OXYCODONE-ACETAMINOPHEN 5-325 MG PO TABS: 2.0000 | ORAL_TABLET | ORAL | Status: DC | PRN

## 2014-05-10 MED ORDER — FENTANYL 2.5 MCG/ML BUPIVACAINE 1/10 % EPIDURAL INFUSION (WH - ANES)
14.0000 mL/h | INTRAMUSCULAR | Status: DC | PRN
  Administered 2014-05-10: 14 mL/h via EPIDURAL
  Filled 2014-05-10: qty 125

## 2014-05-10 MED ORDER — BENZOCAINE-MENTHOL 20-0.5 % EX AERO: 1.0000 "application " | INHALATION_SPRAY | CUTANEOUS | Status: DC | PRN

## 2014-05-10 MED ORDER — LIDOCAINE HCL (PF) 1 % IJ SOLN
30.0000 mL | INTRAMUSCULAR | Status: DC | PRN
  Administered 2014-05-10: 30 mL via SUBCUTANEOUS
  Filled 2014-05-10: qty 30

## 2014-05-10 MED ORDER — ONDANSETRON HCL 4 MG/2ML IJ SOLN
INTRAMUSCULAR | Status: DC | PRN
  Administered 2014-05-10: 4 mg via INTRAVENOUS

## 2014-05-10 MED ORDER — METOCLOPRAMIDE HCL 5 MG/ML IJ SOLN
10.0000 mg | Freq: Once | INTRAMUSCULAR | Status: DC | PRN
Start: 2014-05-10 — End: 2014-05-10

## 2014-05-10 MED ORDER — WITCH HAZEL-GLYCERIN EX PADS: 1.0000 "application " | MEDICATED_PAD | CUTANEOUS | Status: DC | PRN

## 2014-05-10 MED ORDER — LACTATED RINGERS IV SOLN: 500.0000 mL | INTRAVENOUS | Status: DC | PRN

## 2014-05-10 MED ORDER — LIDOCAINE-EPINEPHRINE (PF) 2 %-1:200000 IJ SOLN
INTRAMUSCULAR | Status: DC | PRN
  Administered 2014-05-10: 5 mL via EPIDURAL
  Administered 2014-05-10: 10 mL via EPIDURAL

## 2014-05-10 MED ORDER — PHENYLEPHRINE 40 MCG/ML (10ML) SYRINGE FOR IV PUSH (FOR BLOOD PRESSURE SUPPORT)
80.0000 ug | PREFILLED_SYRINGE | INTRAVENOUS | Status: DC | PRN
  Filled 2014-05-10: qty 10

## 2014-05-10 MED ORDER — OXYTOCIN 40 UNITS IN LACTATED RINGERS INFUSION - SIMPLE MED
62.5000 mL/h | INTRAVENOUS | Status: DC
  Filled 2014-05-10: qty 1000

## 2014-05-10 MED ORDER — SODIUM BICARBONATE 8.4 % IV SOLN
INTRAVENOUS | Status: AC
  Filled 2014-05-10: qty 50

## 2014-05-10 MED ORDER — LIDOCAINE-EPINEPHRINE (PF) 2 %-1:200000 IJ SOLN
INTRAMUSCULAR | Status: AC
  Filled 2014-05-10: qty 20

## 2014-05-10 MED ORDER — ONDANSETRON HCL 4 MG/2ML IJ SOLN: 4.0000 mg | Freq: Four times a day (QID) | INTRAMUSCULAR | Status: DC | PRN

## 2014-05-10 MED ORDER — FENTANYL CITRATE 0.05 MG/ML IJ SOLN
INTRAMUSCULAR | Status: AC
  Filled 2014-05-10: qty 2

## 2014-05-10 MED ORDER — DIPHENHYDRAMINE HCL 25 MG PO CAPS: 25.0000 mg | ORAL_CAPSULE | Freq: Four times a day (QID) | ORAL | Status: DC | PRN

## 2014-05-10 MED ORDER — TETANUS-DIPHTH-ACELL PERTUSSIS 5-2.5-18.5 LF-MCG/0.5 IM SUSP: 0.5000 mL | Freq: Once | INTRAMUSCULAR | Status: DC

## 2014-05-10 MED ORDER — FENTANYL CITRATE 0.05 MG/ML IJ SOLN: 25.0000 ug | INTRAMUSCULAR | Status: DC | PRN

## 2014-05-10 MED ORDER — DIBUCAINE 1 % RE OINT: 1.0000 "application " | TOPICAL_OINTMENT | RECTAL | Status: DC | PRN

## 2014-05-10 MED ORDER — SIMETHICONE 80 MG PO CHEW
80.0000 mg | CHEWABLE_TABLET | ORAL | Status: DC | PRN
  Administered 2014-05-11 – 2014-05-12 (×3): 80 mg via ORAL
  Filled 2014-05-10 (×3): qty 1

## 2014-05-10 MED ORDER — BUPIVACAINE-EPINEPHRINE (PF) 0.5% -1:200000 IJ SOLN
INTRAMUSCULAR | Status: AC
  Filled 2014-05-10: qty 30

## 2014-05-10 MED ORDER — CITRIC ACID-SODIUM CITRATE 334-500 MG/5ML PO SOLN
30.0000 mL | ORAL | Status: DC | PRN
  Administered 2014-05-10: 30 mL via ORAL
  Filled 2014-05-10: qty 15

## 2014-05-10 MED ORDER — BUPIVACAINE-EPINEPHRINE 0.5% -1:200000 IJ SOLN
INTRAMUSCULAR | Status: DC | PRN
  Administered 2014-05-10: 7 mL

## 2014-05-10 MED ORDER — FENTANYL 2.5 MCG/ML BUPIVACAINE 1/10 % EPIDURAL INFUSION (WH - ANES): 14.0000 mL/h | INTRAMUSCULAR | Status: DC | PRN

## 2014-05-10 MED ORDER — MEPERIDINE HCL 25 MG/ML IJ SOLN
INTRAMUSCULAR | Status: DC | PRN
  Administered 2014-05-10 (×2): 12.5 mg via INTRAVENOUS

## 2014-05-10 MED ORDER — DIPHENHYDRAMINE HCL 50 MG/ML IJ SOLN: 12.5000 mg | INTRAMUSCULAR | Status: DC | PRN

## 2014-05-10 MED ORDER — FLEET ENEMA 7-19 GM/118ML RE ENEM: 1.0000 | ENEMA | RECTAL | Status: DC | PRN

## 2014-05-10 MED ORDER — SODIUM CHLORIDE 0.9 % IV SOLN: Freq: Once | INTRAVENOUS | Status: DC

## 2014-05-10 MED ORDER — CLINDAMYCIN PHOSPHATE 900 MG/50ML IV SOLN
900.0000 mg | Freq: Once | INTRAVENOUS | Status: AC
  Administered 2014-05-10: 900 mg via INTRAVENOUS
  Filled 2014-05-10: qty 50

## 2014-05-10 MED ORDER — MEPERIDINE HCL 25 MG/ML IJ SOLN
INTRAMUSCULAR | Status: AC
Start: 2014-05-10 — End: 2014-05-10
  Filled 2014-05-10: qty 1

## 2014-05-10 MED ORDER — MEPERIDINE HCL 25 MG/ML IJ SOLN: 6.2500 mg | INTRAMUSCULAR | Status: DC | PRN

## 2014-05-10 MED ORDER — KETOROLAC TROMETHAMINE 30 MG/ML IJ SOLN
30.0000 mg | Freq: Once | INTRAMUSCULAR | Status: AC
  Administered 2014-05-10: 30 mg via INTRAVENOUS

## 2014-05-10 MED ORDER — ZOLPIDEM TARTRATE 5 MG PO TABS: 5.0000 mg | ORAL_TABLET | Freq: Every evening | ORAL | Status: DC | PRN

## 2014-05-10 MED ORDER — OXYCODONE-ACETAMINOPHEN 5-325 MG PO TABS: 1.0000 | ORAL_TABLET | ORAL | Status: DC | PRN

## 2014-05-10 MED ORDER — PHENYLEPHRINE 40 MCG/ML (10ML) SYRINGE FOR IV PUSH (FOR BLOOD PRESSURE SUPPORT): 80.0000 ug | PREFILLED_SYRINGE | INTRAVENOUS | Status: DC | PRN

## 2014-05-10 MED ORDER — IBUPROFEN 600 MG PO TABS
600.0000 mg | ORAL_TABLET | Freq: Four times a day (QID) | ORAL | Status: DC
  Administered 2014-05-11 – 2014-05-12 (×5): 600 mg via ORAL
  Filled 2014-05-10 (×6): qty 1

## 2014-05-10 MED ORDER — LACTATED RINGERS IV SOLN
INTRAVENOUS | Status: DC
  Administered 2014-05-10: 12:00:00 via INTRAVENOUS

## 2014-05-10 MED ORDER — PHENYLEPHRINE 40 MCG/ML (10ML) SYRINGE FOR IV PUSH (FOR BLOOD PRESSURE SUPPORT)
PREFILLED_SYRINGE | INTRAVENOUS | Status: AC
  Filled 2014-05-10: qty 5

## 2014-05-10 MED ORDER — ONDANSETRON HCL 4 MG/2ML IJ SOLN: 4.0000 mg | INTRAMUSCULAR | Status: DC | PRN

## 2014-05-10 MED ORDER — KETOROLAC TROMETHAMINE 30 MG/ML IJ SOLN
INTRAMUSCULAR | Status: AC
  Filled 2014-05-10: qty 1

## 2014-05-10 MED ORDER — LIDOCAINE HCL (PF) 1 % IJ SOLN
INTRAMUSCULAR | Status: DC | PRN
  Administered 2014-05-10: 4 mL
  Administered 2014-05-10: 6 mL

## 2014-05-10 MED ORDER — PRENATAL MULTIVITAMIN CH
1.0000 | ORAL_TABLET | Freq: Every day | ORAL | Status: DC
  Administered 2014-05-11: 1 via ORAL
  Filled 2014-05-10 (×2): qty 1

## 2014-05-10 MED ORDER — LACTATED RINGERS IV SOLN: 500.0000 mL | Freq: Once | INTRAVENOUS | Status: DC

## 2014-05-10 MED ORDER — ONDANSETRON HCL 4 MG PO TABS: 4.0000 mg | ORAL_TABLET | ORAL | Status: DC | PRN

## 2014-05-10 MED ORDER — OXYTOCIN BOLUS FROM INFUSION
500.0000 mL | INTRAVENOUS | Status: DC
  Administered 2014-05-10: 500 mL via INTRAVENOUS

## 2014-05-10 SURGICAL SUPPLY — 28 items
CHLORAPREP W/TINT 26ML (MISCELLANEOUS) ×3 IMPLANT
CLOTH BEACON ORANGE TIMEOUT ST (SAFETY) ×3 IMPLANT
CONTAINER PREFILL 10% NBF 15ML (MISCELLANEOUS) ×6 IMPLANT
DRSG OPSITE POSTOP 3X4 (GAUZE/BANDAGES/DRESSINGS) ×3 IMPLANT
ELECT REM PT RETURN 9FT ADLT (ELECTROSURGICAL) ×3
ELECTRODE REM PT RTRN 9FT ADLT (ELECTROSURGICAL) IMPLANT
GLOVE BIOGEL PI IND STRL 8.5 (GLOVE) ×1 IMPLANT
GLOVE BIOGEL PI INDICATOR 8.5 (GLOVE) ×2
GLOVE ECLIPSE 8.0 STRL XLNG CF (GLOVE) ×6 IMPLANT
GOWN STRL REUS W/TWL 2XL LVL3 (GOWN DISPOSABLE) ×3 IMPLANT
GOWN STRL REUS W/TWL LRG LVL3 (GOWN DISPOSABLE) ×6 IMPLANT
NDL HYPO 25X1 1.5 SAFETY (NEEDLE) ×1 IMPLANT
NEEDLE HYPO 25X1 1.5 SAFETY (NEEDLE) ×3 IMPLANT
NS IRRIG 1000ML POUR BTL (IV SOLUTION) ×3 IMPLANT
PACK ABDOMINAL MINOR (CUSTOM PROCEDURE TRAY) ×3 IMPLANT
PENCIL BUTTON HOLSTER BLD 10FT (ELECTRODE) ×2 IMPLANT
SPONGE GAUZE 2X2 8PLY STER LF (GAUZE/BANDAGES/DRESSINGS) ×1
SPONGE GAUZE 2X2 8PLY STRL LF (GAUZE/BANDAGES/DRESSINGS) ×1 IMPLANT
SPONGE LAP 4X18 X RAY DECT (DISPOSABLE) ×2 IMPLANT
SUT MNCRL AB 3-0 PS2 27 (SUTURE) ×3 IMPLANT
SUT PLAIN 0 NONE (SUTURE) ×3 IMPLANT
SUT VIC AB 2-0 CT1 27 (SUTURE) ×3
SUT VIC AB 2-0 CT1 TAPERPNT 27 (SUTURE) ×1 IMPLANT
SYR CONTROL 10ML LL (SYRINGE) ×3 IMPLANT
TAPE CLOTH SURG 4X10 WHT LF (GAUZE/BANDAGES/DRESSINGS) ×2 IMPLANT
TOWEL OR 17X24 6PK STRL BLUE (TOWEL DISPOSABLE) ×6 IMPLANT
TRAY FOLEY CATH 14FR (SET/KITS/TRAYS/PACK) ×3 IMPLANT
WATER STERILE IRR 1000ML POUR (IV SOLUTION) ×3 IMPLANT

## 2014-05-10 NOTE — Anesthesia Preprocedure Evaluation (Signed)

## 2014-05-10 NOTE — Progress Notes (Signed)
  Subjective: Now comfortable with epidural.  Aware of contractions, no pain.  Objective: BP 133/69  Pulse 71  Temp(Src) 97.5 F (36.4 C) (Oral)  Resp 18  Ht  (1.778 m)  Wt 260 lb (117.935 kg)  BMI 37.31 kg/m2      FHT: Category 1--single deep variable with 2 long contractions, moderate variability throughout, reactive before and after UC:   regular, every 4-5 minutes SVE:   Dilation: 8 Exam by:: Manfred Arch, CNM 100%, vtx, 0/+1 station. BBOW--AROM, clear fluid    Assessment:  Progressive labor GBS positive  Plan: Continue current care. Await increased pressure/urge to push.  Nigel Bridgeman CNM 05/10/2014, 1:36 PM

## 2014-05-10 NOTE — Transfer of Care (Signed)
Immediate Anesthesia Transfer of Care Note  Patient: Wendy Chapman  Procedure(s) Performed: Procedure(s): POST PARTUM TUBAL LIGATION (Bilateral)  Patient Location: PACU  Anesthesia Type:Epidural  Level of Consciousness: awake, alert  and oriented  Airway & Oxygen Therapy: Patient Spontanous Breathing  Post-op Assessment: Report given to PACU RN and Post -op Vital signs reviewed and stable  Post vital signs: Reviewed and stable  Complications: No apparent anesthesia complications

## 2014-05-10 NOTE — Op Note (Signed)
OPERATIVE NOTE   Wendy Chapman   DOB: June 01, 1981   MRN: [id number]   CSN: [id number]   Date of Surgery: 05/10/2014   Preoperative Diagnosis:   Postpartum day # 0  Desires Sterilization Obesity   Postoperative Diagnosis:   Same   Procedure:   Modified [place] Postpartum Bilateral Tubal Sterilization Procedure   Surgeon:   Leonard Schwartz, M.D.   Assistant:   None   Anesthetic:   Epidural  Disposition:   The patient is a 33 y.o.-year-old female, now [id number], who desires sterilization. She understands the indications for surgical procedure. She accepts the risk of, but not limited to, anesthetic complications, bleeding, infections, possible damage to the surrounding organs, and possible tubal failure (10 per 1000).   Findings:   The fallopian tube on the left was normal. The left ovary was normal. The right fallopian tube was scarred to the posterior uterus. We were unable to identify the right fimbriated end. We were unable to identify the right ovary. The patient had a large amount of adipose tissue in the peritoneal cavity.   Procedure:   The patient was taken to the operating room where  Epidural anesthesia was noted to be adequate for surgery. The patient's abdomen was prepped with multiple layers of DuraPrep. A foley catheter was placed after sterile preparation. She was then sterilely draped. The subumbilical area was injected with half percent Marcaine with epinephrine. A subumbilical incision was made and carried sharply through the subcutaneous tissue, the fascia, and the anterior peritoneum. The left fallopian tube was identified and followed to its fimbriated end. A knuckle of tube was made on the left using a free tie and individual ties of 0 plain catgut suture. The knuckle of tube was then excised. Hemostasis was adequate. The right round ligament was identified. The right fallopian tube was identified. Scar tissue was present. The adhesions were  lysed. We tried to follow the right fallopian tube to its fimbriated end. We were unable to identify the right fimbriated and. We were unable to identify the right ovary. A portion of the right tube was isolated and then ligated using 20 plain catgut. A portion of the tube was excised.  Again hemostasis was adequate. The fascia and the anterior peritoneum were closed using a running suture of 2-0 Vicryl. The skin was reapproximated using a subcuticular suture of 3-0 Monocryl. Sponge, needle, and instrument counts were correct on 2 occasions. The estimated blood loss was 10 cc's. The patient tolerated her procedure well. She was transported to the recovery room in stable condition. The cut portions of the fallopian tubes were sent to pathology.   Leonard Schwartz, M.D.

## 2014-05-10 NOTE — Lactation Note (Signed)
This note was copied from the chart of Wendy Jamar Casagrande. Lactation Consultation Note  Per Marcelino Duster RN request, provided mother with information regarding Flexeril form Dwain Sarna. Suggest she discuss it with her physician.  Patient Name: Wendy Chapman Today's Date: 05/10/2014     Maternal Data    Feeding Feeding Type: Breast Fed Length of feed: 55 min  LATCH Score/Interventions Latch: Repeated attempts needed to sustain latch, nipple held in mouth throughout feeding, stimulation needed to elicit sucking reflex.  Audible Swallowing: Spontaneous and intermittent  Type of Nipple: Everted at rest and after stimulation  Comfort (Breast/Nipple): Soft / non-tender     Hold (Positioning): No assistance needed to correctly position infant at breast.  LATCH Score: 9  Lactation Tools Discussed/Used     Consult Status      Wendy Chapman [facility] 05/10/2014, 10:49 PM

## 2014-05-10 NOTE — Anesthesia Procedure Notes (Signed)
Epidural Patient location during procedure: OB  Preanesthetic Checklist Completed: patient identified, site marked, surgical consent, pre-op evaluation, timeout performed, IV checked, risks and benefits discussed and monitors and equipment checked  Epidural Patient position: sitting Prep: site prepped and draped and DuraPrep Patient monitoring: continuous pulse ox and blood pressure Approach: midline Injection technique: LOR air  Needle:  Needle type: Tuohy  Needle gauge: 17 G Needle length: 9 cm and 9 Needle insertion depth: 7 cm Catheter type: closed end flexible Catheter size: 19 Gauge Catheter at skin depth: 14 cm Test dose: negative  Assessment Events: blood not aspirated, injection not painful, no injection resistance, negative IV test and no paresthesia  Additional Notes Dosing of Epidural:  1st dose, through catheter .............................................  Xylocaine 40 mg  2nd dose, through catheter, after waiting 3 minutes.........Xylocaine 60 mg    ( 1% Xylo charted as a single dose in Epic Meds for ease of charting; actual dosing was fractionated as above, for saftey's sake)  As each dose occurred, patient was free of IV sx; and patient exhibited no evidence of SA injection.  Patient is more comfortable after epidural dosed. Please see RN's note for documentation of vital signs,and FHR which are stable.  Patient reminded not to try to ambulate with numb legs, and that an RN must be present when she attempts to get up.       
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
## 2014-05-10 NOTE — Progress Notes (Signed)
  Subjective: Getting comfortable s/p epidural placement--had been very uncomfortable prior to placement.  Objective: BP 116/71  Pulse 78  Temp(Src) 97.5 F (36.4 C) (Oral)  Resp 20  Ht  (1.778 m)  Wt 260 lb (117.935 kg)  BMI 37.31 kg/m2      FHT: Category 1 UC:   q 3-5 min, strong SVE:   Deferred at present  Assessment:  Active labor GBS positive--received Clindamycin x 1 at 1210 Desires BTL.  Plan: Continue observation. Recheck cervix in next hour or prn.  Nigel Bridgeman CNM 05/10/2014, 1:00 PM

## 2014-05-10 NOTE — Progress Notes (Signed)
Ms. EVALEEN SANT is a 33 y.o. year old female.  Subjective:  Desires sterilization.  Objective:  BP 117/71  Pulse 80  Temp(Src) 98 F (36.7 C) (Oral)  Resp 18  Ht  (1.778 m)  Wt 260 lb (117.935 kg)  BMI 37.31 kg/m2  Breastfeeding? Unknown   CBC    Component Value Date/Time   WBC 11.4* 05/10/2014 1155   RBC 4.16 05/10/2014 1155   HGB 11.5* 05/10/2014 1155   HCT 35.4* 05/10/2014 1155   PLT 309 05/10/2014 1155   MCV 85.1 05/10/2014 1155   MCH 27.6 05/10/2014 1155   MCHC 32.5 05/10/2014 1155   RDW 14.6 05/10/2014 1155   LYMPHSABS 1.9 05/03/2014 1537   MONOABS 0.5 05/03/2014 1537   EOSABS 0.1 05/03/2014 1537   BASOSABS 0.0 05/03/2014 1537     Stable exam.  Assessment:  Desires sterilization.  Plan:  Tubal sterilization procedure. R and B reviewed.  Leonard Schwartz M.D.  05/10/14  5:31 PM

## 2014-05-10 NOTE — Anesthesia Postprocedure Evaluation (Signed)
  Anesthesia Post-op Note  Patient: Wendy Chapman  Procedure(s) Performed: Procedure(s): POST PARTUM TUBAL LIGATION (Bilateral)  Patient Location: PACU  Anesthesia Type:Epidural  Level of Consciousness: awake, alert  and oriented  Airway and Oxygen Therapy: Patient Spontanous Breathing  Post-op Pain: none  Post-op Assessment: Post-op Vital signs reviewed, Patient's Cardiovascular Status Stable, Respiratory Function Stable, Patent Airway, No signs of Nausea or vomiting, Pain level controlled, No headache, No backache, No residual numbness and No residual motor weakness  Post-op Vital Signs: Reviewed and stable  Last Vitals:  Filed Vitals:   05/10/14 1945  BP: 134/70  Pulse: 75  Temp:   Resp: 21    Complications: No apparent anesthesia complications

## 2014-05-10 NOTE — Anesthesia Preprocedure Evaluation (Signed)
Anesthesia Evaluation  Patient identified by MRN, date of birth, ID band Patient awake    Reviewed: Allergy & Precautions, H&P , NPO status , Patient's Chart, lab work & pertinent test results  Airway Mallampati: II TM Distance: >3 FB Neck ROM: full    Dental no notable dental hx. (+) Teeth Intact   Pulmonary neg pulmonary ROS,  breath sounds clear to auscultation  Pulmonary exam normal       Cardiovascular Exercise Tolerance: Good negative cardio ROS  Rhythm:regular Rate:Normal     Neuro/Psych negative neurological ROS  negative psych ROS   GI/Hepatic negative GI ROS, Neg liver ROS,   Endo/Other  negative endocrine ROSObesity  Renal/GU negative Renal ROS  negative genitourinary   Musculoskeletal negative musculoskeletal ROS (+)   Abdominal Normal abdominal exam  (+) + obese,   Peds  Hematology negative hematology ROS (+)   Anesthesia Other Findings       Reproductive/Obstetrics negative OB ROS Desires Post Partum Tubal ligation                           Anesthesia Physical  Anesthesia Plan  ASA: III  Anesthesia Plan: Epidural   Post-op Pain Management:    Induction:   Airway Management Planned: Natural Airway  Additional Equipment:   Intra-op Plan:   Post-operative Plan:   Informed Consent: I have reviewed the patients History and Physical, chart, labs and discussed the procedure including the risks, benefits and alternatives for the proposed anesthesia with the patient or authorized representative who has indicated his/her understanding and acceptance.   Dental Advisory Given  Plan Discussed with: Anesthesiologist, CRNA and Surgeon  Anesthesia Plan Comments: (Will use existing epidural for PPTL.)        Anesthesia Quick Evaluation

## 2014-05-10 NOTE — Anesthesia Postprocedure Evaluation (Signed)
  Anesthesia Post-op Note  Patient: Wendy Chapman  Procedure(s) Performed: Lumbar Epidural for L&D  Patient Location: PACU and Mother/Baby  Anesthesia Type:Epidural  Level of Consciousness: awake, alert  and oriented  Airway and Oxygen Therapy: Patient Spontanous Breathing  Post-op Pain: none  Post-op Assessment: Post-op Vital signs reviewed, Patient's Cardiovascular Status Stable, Respiratory Function Stable, Patent Airway, No signs of Nausea or vomiting, Pain level controlled, No headache and No backache  Post-op Vital Signs: Reviewed and stable  Last Vitals:  Filed Vitals:   05/10/14 1631  BP: 117/71  Pulse: 80  Temp:   Resp: 18    Complications: No apparent anesthesia complications

## 2014-05-11 ENCOUNTER — Encounter (HOSPITAL_COMMUNITY): Payer: Self-pay | Admitting: Obstetrics and Gynecology

## 2014-05-11 LAB — CBC
HCT: 30.3 % — ABNORMAL LOW (ref 36.0–46.0)
Hemoglobin: 9.8 g/dL — ABNORMAL LOW (ref 12.0–15.0)
MCH: 27.3 pg (ref 26.0–34.0)
MCHC: 32.3 g/dL (ref 30.0–36.0)
MCV: 84.4 fL (ref 78.0–100.0)
PLATELETS: 285 10*3/uL (ref 150–400)
RBC: 3.59 MIL/uL — ABNORMAL LOW (ref 3.87–5.11)
RDW: 14.5 % (ref 11.5–15.5)
WBC: 11.6 10*3/uL — ABNORMAL HIGH (ref 4.0–10.5)

## 2014-05-11 MED ORDER — RHO D IMMUNE GLOBULIN 1500 UNIT/2ML IJ SOSY
300.0000 ug | PREFILLED_SYRINGE | Freq: Once | INTRAMUSCULAR | Status: AC
  Administered 2014-05-11: 300 ug via INTRAVENOUS
  Filled 2014-05-11: qty 2

## 2014-05-11 NOTE — Discharge Instructions (Signed)
Breastfeeding Deciding to breastfeed is one of the best choices you can make for you and your baby. A change in hormones during pregnancy causes your breast tissue to grow and increases the number and size of your milk ducts. These hormones also allow proteins, sugars, and fats from your blood supply to make breast milk in your milk-producing glands. Hormones prevent breast milk from being released before your baby is born as well as prompt milk flow after birth. Once breastfeeding has begun, thoughts of your baby, as well as his or her sucking or crying, can stimulate the release of milk from your milk-producing glands.  BENEFITS OF BREASTFEEDING For Your Baby  Your first milk (colostrum) helps your baby's digestive system function better.   There are antibodies in your milk that help your baby fight off infections.   Your baby has a lower incidence of asthma, allergies, and sudden infant death syndrome.   The nutrients in breast milk are better for your baby than infant formulas and are designed uniquely for your baby's needs.   Breast milk improves your baby's brain development.   Your baby is less likely to develop other conditions, such as childhood obesity, asthma, or type 2 diabetes mellitus.  For You   Breastfeeding helps to create a very special bond between you and your baby.   Breastfeeding is convenient. Breast milk is always available at the correct temperature and costs nothing.   Breastfeeding helps to burn calories and helps you lose the weight gained during pregnancy.   Breastfeeding makes your uterus contract to its prepregnancy size faster and slows bleeding (lochia) after you give birth.   Breastfeeding helps to lower your risk of developing type 2 diabetes mellitus, osteoporosis, and breast or ovarian cancer later in life. SIGNS THAT YOUR BABY IS HUNGRY Early Signs of Hunger  Increased alertness or activity.  Stretching.  Movement of the head from  side to side.  Movement of the head and opening of the mouth when the corner of the mouth or cheek is stroked (rooting).  Increased sucking sounds, smacking lips, cooing, sighing, or squeaking.  Hand-to-mouth movements.  Increased sucking of fingers or hands. Late Signs of Hunger  Fussing.  Intermittent crying. Extreme Signs of Hunger Signs of extreme hunger will require calming and consoling before your baby will be able to breastfeed successfully. Do not wait for the following signs of extreme hunger to occur before you initiate breastfeeding:   Restlessness.  A loud, strong cry.   Screaming. BREASTFEEDING BASICS Breastfeeding Initiation  Find a comfortable place to sit or lie down, with your neck and back well supported.  Place a pillow or rolled up blanket under your baby to bring him or her to the level of your breast (if you are seated). Nursing pillows are specially designed to help support your arms and your baby while you breastfeed.  Make sure that your baby's abdomen is facing your abdomen.   Gently massage your breast. With your fingertips, massage from your chest wall toward your nipple in a circular motion. This encourages milk flow. You may need to continue this action during the feeding if your milk flows slowly.  Support your breast with 4 fingers underneath and your thumb above your nipple. Make sure your fingers are well away from your nipple and your baby's mouth.   Stroke your baby's lips gently with your finger or nipple.   When your baby's mouth is open wide enough, quickly bring your baby to your   breast, placing your entire nipple and as much of the colored area around your nipple (areola) as possible into your baby's mouth.   More areola should be visible above your baby's upper lip than below the lower lip.   Your baby's tongue should be between his or her lower gum and your breast.   Ensure that your baby's mouth is correctly positioned  around your nipple (latched). Your baby's lips should create a seal on your breast and be turned out (everted).  It is common for your baby to suck about 2-3 minutes in order to start the flow of breast milk. Latching Teaching your baby how to latch on to your breast properly is very important. An improper latch can cause nipple pain and decreased milk supply for you and poor weight gain in your baby. Also, if your baby is not latched onto your nipple properly, he or she may swallow some air during feeding. This can make your baby fussy. Burping your baby when you switch breasts during the feeding can help to get rid of the air. However, teaching your baby to latch on properly is still the best way to prevent fussiness from swallowing air while breastfeeding. Signs that your baby has successfully latched on to your nipple:    Silent tugging or silent sucking, without causing you pain.   Swallowing heard between every 3-4 sucks.    Muscle movement above and in front of his or her ears while sucking.  Signs that your baby has not successfully latched on to nipple:   Sucking sounds or smacking sounds from your baby while breastfeeding.  Nipple pain. If you think your baby has not latched on correctly, slip your finger into the corner of your baby's mouth to break the suction and place it between your baby's gums. Attempt breastfeeding initiation again. Signs of Successful Breastfeeding Signs from your baby:   A gradual decrease in the number of sucks or complete cessation of sucking.   Falling asleep.   Relaxation of his or her body.   Retention of a small amount of milk in his or her mouth.   Letting go of your breast by himself or herself. Signs from you:  Breasts that have increased in firmness, weight, and size 1-3 hours after feeding.   Breasts that are softer immediately after breastfeeding.  Increased milk volume, as well as a change in milk consistency and color by  the fifth day of breastfeeding.   Nipples that are not sore, cracked, or bleeding. Signs That Your Baby is Getting Enough Milk  Wetting at least 3 diapers in a 24-hour period. The urine should be clear and pale yellow by age 5 days.  At least 3 stools in a 24-hour period by age 5 days. The stool should be soft and yellow.  At least 3 stools in a 24-hour period by age 7 days. The stool should be seedy and yellow.  No loss of weight greater than 10% of birth weight during the first 3 days of age.  Average weight gain of 4-7 ounces (113-198 g) per week after age 4 days.  Consistent daily weight gain by age 5 days, without weight loss after the age of 2 weeks. After a feeding, your baby may spit up a small amount. This is common. BREASTFEEDING FREQUENCY AND DURATION Frequent feeding will help you make more milk and can prevent sore nipples and breast engorgement. Breastfeed when you feel the need to reduce the fullness of your breasts   or when your baby shows signs of hunger. This is called "breastfeeding on demand." Avoid introducing a pacifier to your baby while you are working to establish breastfeeding (the first 4-6 weeks after your baby is born). After this time you may choose to use a pacifier. Research has shown that pacifier use during the first year of a baby's life decreases the risk of sudden infant death syndrome (SIDS). Allow your baby to feed on each breast as long as he or she wants. Breastfeed until your baby is finished feeding. When your baby unlatches or falls asleep while feeding from the first breast, offer the second breast. Because newborns are often sleepy in the first few weeks of life, you may need to awaken your baby to get him or her to feed. Breastfeeding times will vary from baby to baby. However, the following rules can serve as a guide to help you ensure that your baby is properly fed:  Newborns (babies 4 weeks of age or younger) may breastfeed every 1-3  hours.  Newborns should not go longer than 3 hours during the day or 5 hours during the night without breastfeeding.  You should breastfeed your baby a minimum of 8 times in a 24-hour period until you begin to introduce solid foods to your baby at around 6 months of age. BREAST MILK PUMPING Pumping and storing breast milk allows you to ensure that your baby is exclusively fed your breast milk, even at times when you are unable to breastfeed. This is especially important if you are going back to work while you are still breastfeeding or when you are not able to be present during feedings. Your lactation consultant can give you guidelines on how long it is safe to store breast milk.  A breast pump is a machine that allows you to pump milk from your breast into a sterile bottle. The pumped breast milk can then be stored in a refrigerator or freezer. Some breast pumps are operated by hand, while others use electricity. Ask your lactation consultant which type will work best for you. Breast pumps can be purchased, but some hospitals and breastfeeding support groups lease breast pumps on a monthly basis. A lactation consultant can teach you how to hand express breast milk, if you prefer not to use a pump.  CARING FOR YOUR BREASTS WHILE YOU BREASTFEED Nipples can become dry, cracked, and sore while breastfeeding. The following recommendations can help keep your breasts moisturized and healthy:  Avoid using soap on your nipples.   Wear a supportive bra. Although not required, special nursing bras and tank tops are designed to allow access to your breasts for breastfeeding without taking off your entire bra or top. Avoid wearing underwire-style bras or extremely tight bras.  Air dry your nipples for 3-4minutes after each feeding.   Use only cotton bra pads to absorb leaked breast milk. Leaking of breast milk between feedings is normal.   Use lanolin on your nipples after breastfeeding. Lanolin helps to  maintain your skin's normal moisture barrier. If you use pure lanolin, you do not need to wash it off before feeding your baby again. Pure lanolin is not toxic to your baby. You may also hand express a few drops of breast milk and gently massage that milk into your nipples and allow the milk to air dry. In the first few weeks after giving birth, some women experience extremely full breasts (engorgement). Engorgement can make your breasts feel heavy, warm, and tender to the   touch. Engorgement peaks within 3-5 days after you give birth. The following recommendations can help ease engorgement:  Completely empty your breasts while breastfeeding or pumping. You may want to start by applying warm, moist heat (in the shower or with warm water-soaked hand towels) just before feeding or pumping. This increases circulation and helps the milk flow. If your baby does not completely empty your breasts while breastfeeding, pump any extra milk after he or she is finished.  Wear a snug bra (nursing or regular) or tank top for 1-2 days to signal your body to slightly decrease milk production.  Apply ice packs to your breasts, unless this is too uncomfortable for you.  Make sure that your baby is latched on and positioned properly while breastfeeding. If engorgement persists after 48 hours of following these recommendations, contact your health care provider or a lactation consultant. OVERALL HEALTH CARE RECOMMENDATIONS WHILE BREASTFEEDING  Eat healthy foods. Alternate between meals and snacks, eating 3 of each per day. Because what you eat affects your breast milk, some of the foods may make your baby more irritable than usual. Avoid eating these foods if you are sure that they are negatively affecting your baby.  Drink milk, fruit juice, and water to satisfy your thirst (about 10 glasses a day).   Rest often, relax, and continue to take your prenatal vitamins to prevent fatigue, stress, and anemia.  Continue  breast self-awareness checks.  Avoid chewing and smoking tobacco.  Avoid alcohol and drug use. Some medicines that may be harmful to your baby can pass through breast milk. It is important to ask your health care provider before taking any medicine, including all over-the-counter and prescription medicine as well as vitamin and herbal supplements. It is possible to become pregnant while breastfeeding. If birth control is desired, ask your health care provider about options that will be safe for your baby. SEEK MEDICAL CARE IF:   You feel like you want to stop breastfeeding or have become frustrated with breastfeeding.  You have painful breasts or nipples.  Your nipples are cracked or bleeding.  Your breasts are red, tender, or warm.  You have a swollen area on either breast.  You have a fever or chills.  You have nausea or vomiting.  You have drainage other than breast milk from your nipples.  Your breasts do not become full before feedings by the fifth day after you give birth.  You feel sad and depressed.  Your baby is too sleepy to eat well.  Your baby is having trouble sleeping.   Your baby is wetting less than 3 diapers in a 24-hour period.  Your baby has less than 3 stools in a 24-hour period.  Your baby's skin or the white part of his or her eyes becomes yellow.   Your baby is not gaining weight by 5 days of age. SEEK IMMEDIATE MEDICAL CARE IF:   Your baby is overly tired (lethargic) and does not want to wake up and feed.  Your baby develops an unexplained fever. Document Released: 08/04/2005 Document Revised: 08/09/2013 Document Reviewed: 01/26/2013 [employer] Patient Information 2015 [employer]. This information is not intended to replace advice given to you by your health care provider. Make sure you discuss any questions you have with your health care provider.  

## 2014-05-11 NOTE — Lactation Note (Signed)
This note was copied from the chart of Wendy Chapman. Lactation Consultation Note Experienced BF mom of 3 other children, her 2 boys weaned themselves at 10 months, her daughter BF for 18 months. Denies any difficulty or challenges. States this baby is latching well, denies soreness, states baby is feeding great. Mom encouraged to feed baby 8-12 times/24 hours and with feeding cues.  Educated about newborn behavior. Encouraged to call for assistance if needed and to verify proper latch.[facility] brochure given w/resources, support groups and LC services.Referred to Baby and Me Book in Breastfeeding section Pg. 22-23 for position options and Proper latch demonstration. Patient Name: Wendy Dessiree Sze WRUEA'V Date: 05/11/2014 Reason for consult: Initial assessment   Maternal Data Has patient been taught Hand Expression?: Yes Does the patient have breastfeeding experience prior to this delivery?: Yes  Feeding Feeding Type: Breast Fed  LATCH Score/Interventions                      Lactation Tools Discussed/Used     Consult Status Consult Status: Follow-up Date: 05/12/14 Follow-up type: In-patient    Kareem Aul, Diamond Nickel 05/11/2014, 7:07 AM

## 2014-05-11 NOTE — Anesthesia Postprocedure Evaluation (Signed)
  Anesthesia Post-op Note  Patient: Wendy Chapman  Procedure(s) Performed: Procedure(s): POST PARTUM TUBAL LIGATION (Bilateral)  Patient Location: Mother/Baby  Anesthesia Type:Epidural  Level of Consciousness: awake, alert , oriented and patient cooperative  Airway and Oxygen Therapy: Patient Spontanous Breathing  Post-op Pain: mild  Post-op Assessment: Patient's Cardiovascular Status Stable, Respiratory Function Stable, No headache, No backache, No residual numbness and No residual motor weakness  Post-op Vital Signs: stable  Last Vitals:  Filed Vitals:   05/11/14 0610  BP: 118/66  Pulse: 59  Temp: 36.9 C  Resp: 18    Complications: No apparent anesthesia complications

## 2014-05-11 NOTE — Progress Notes (Addendum)
Wendy Chapman   Subjective: Post Partum Day 1 Vaginal delivery, 2nd degree;Perineal degree laceration Patient up ad lib, denies syncope or dizziness. Reports consuming regular diet without issues and denies N/V No issues with urination and reports bleeding is appropriate  Feeding:  Breast/bottle Nipples sore Pain at incision site Contraceptive plan:   BTL  Objective: Temp:  [97.5 F (36.4 C)-98.9 F (37.2 C)] 98.5 F (36.9 C) (09/24 0610) Pulse Rate:  [59-126] 59 (09/24 0610) Resp:  [14-28] 18 (09/24 0610) BP: (107-139)/(45-82) 118/66 mmHg (09/24 0610) SpO2:  [95 %-100 %] 99 % (09/24 0205) Weight:  [117.935 kg (260 lb)] 117.935 kg (260 lb) (09/23 1154)  Physical Exam:  General: alert and cooperative Ext: WNL, 2+ edema. No evidence of DVT seen on physical exam. Breast: Soft filling Lungs: CTAB Heart RRR without murmur  Abdomen:  Soft, fundus firm, lochia scant, + bowel sounds, non distended, non tender Lochia: appropriate Uterine Fundus: firm Laceration: healing well    Recent Labs  05/10/14 1155 05/11/14 0520  HGB 11.5* 9.8*  HCT 35.4* 30.3*    Assessment S/P Vaginal Delivery-Day 1 Stable  Normal Involution Breastfeeding/Bottlefeeding Circumcision: in patient  Plan: Continue current care Plan for discharge tomorrow, Breastfeeding and Circumcision prior to discharge Lactation support   Kage Willmann, CNM, MSN 05/11/2014, 9:37 AM

## 2014-05-11 NOTE — Addendum Note (Signed)
Addendum created 05/11/14 0949 by Earmon Phoenix, CRNA   Modules edited: Charges VN, Notes Section   Notes Section:  File: [id number]

## 2014-05-11 NOTE — Anesthesia Postprocedure Evaluation (Signed)
  Anesthesia Post-op Note  Patient: Wendy Chapman  Procedure(s) Performed: * No procedures listed *  Patient Location: Mother/Baby  Anesthesia Type:Epidural  Level of Consciousness: awake, alert , oriented and patient cooperative  Airway and Oxygen Therapy: Patient Spontanous Breathing  Post-op Pain: mild  Post-op Assessment: Patient's Cardiovascular Status Stable, Respiratory Function Stable, No headache, No backache, No residual numbness and No residual motor weakness  Post-op Vital Signs: stable  Last Vitals:  Filed Vitals:   05/11/14 0610  BP: 118/66  Pulse: 59  Temp: 36.9 C  Resp: 18    Complications: No apparent anesthesia complications

## 2014-05-11 NOTE — Addendum Note (Signed)
Addendum created 05/11/14 0951 by Earmon Phoenix, CRNA   Modules edited: Notes Section   Notes Section:  File: [id number]

## 2014-05-11 NOTE — Progress Notes (Signed)
Patient ID: Wendy Chapman, female   DOB: 09-04-1980, 33 y.o.   MRN: [id number]  I saw and examined patient at bedside and agree with findings, assessment and plan as outlined by [facility] Standard today.  Patient desired circumcision of neonate.  Discussed risks, benefits and alternatives of the circumcision.  All questions were answered.
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
## 2014-05-12 LAB — RH IG WORKUP (INCLUDES ABO/RH)
ABO/RH(D): O NEG
FETAL SCREEN: NEGATIVE
Gestational Age(Wks): 40.3
Unit division: 0

## 2014-05-12 MED ORDER — OXYCODONE-ACETAMINOPHEN 5-325 MG PO TABS: 1.0000 | ORAL_TABLET | ORAL | Status: DC | PRN

## 2014-05-12 MED ORDER — IBUPROFEN 600 MG PO TABS: 600.0000 mg | ORAL_TABLET | Freq: Four times a day (QID) | ORAL | Status: DC | PRN
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
## 2014-06-19 ENCOUNTER — Encounter (HOSPITAL_COMMUNITY): Payer: Self-pay | Admitting: Obstetrics and Gynecology
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
## 2015-12-13 DIAGNOSIS — F419 Anxiety disorder, unspecified: Secondary | ICD-10-CM | POA: Diagnosis not present

## 2016-01-15 DIAGNOSIS — F419 Anxiety disorder, unspecified: Secondary | ICD-10-CM | POA: Diagnosis not present

## 2016-02-11 DIAGNOSIS — F419 Anxiety disorder, unspecified: Secondary | ICD-10-CM | POA: Diagnosis not present

## 2016-03-10 DIAGNOSIS — F419 Anxiety disorder, unspecified: Secondary | ICD-10-CM | POA: Diagnosis not present

## 2016-04-07 DIAGNOSIS — F419 Anxiety disorder, unspecified: Secondary | ICD-10-CM | POA: Diagnosis not present

## 2016-04-09 DIAGNOSIS — H6502 Acute serous otitis media, left ear: Secondary | ICD-10-CM | POA: Diagnosis not present

## 2016-04-15 DIAGNOSIS — J0100 Acute maxillary sinusitis, unspecified: Secondary | ICD-10-CM | POA: Diagnosis not present

## 2016-04-15 DIAGNOSIS — H6502 Acute serous otitis media, left ear: Secondary | ICD-10-CM | POA: Diagnosis not present

## 2016-04-29 DIAGNOSIS — F419 Anxiety disorder, unspecified: Secondary | ICD-10-CM | POA: Diagnosis not present

## 2016-05-02 ENCOUNTER — Encounter: Payer: Self-pay | Admitting: Primary Care

## 2016-05-02 ENCOUNTER — Ambulatory Visit (INDEPENDENT_AMBULATORY_CARE_PROVIDER_SITE_OTHER): Payer: BLUE CROSS/BLUE SHIELD | Admitting: Primary Care

## 2016-05-02 VITALS — BP 130/80 | HR 68 | Temp 98.2°F | Ht 69.0 in | Wt 245.1 lb

## 2016-05-02 DIAGNOSIS — Z862 Personal history of diseases of the blood and blood-forming organs and certain disorders involving the immune mechanism: Secondary | ICD-10-CM

## 2016-05-02 DIAGNOSIS — E669 Obesity, unspecified: Secondary | ICD-10-CM

## 2016-05-02 DIAGNOSIS — R52 Pain, unspecified: Secondary | ICD-10-CM | POA: Diagnosis not present

## 2016-05-02 DIAGNOSIS — L309 Dermatitis, unspecified: Secondary | ICD-10-CM

## 2016-05-02 LAB — VITAMIN B12: Vitamin B-12: 383 pg/mL (ref 211–911)

## 2016-05-02 LAB — COMPREHENSIVE METABOLIC PANEL
ALT: 15 U/L (ref 0–35)
AST: 17 U/L (ref 0–37)
Albumin: 3.9 g/dL (ref 3.5–5.2)
Alkaline Phosphatase: 70 U/L (ref 39–117)
BILIRUBIN TOTAL: 0.4 mg/dL (ref 0.2–1.2)
BUN: 9 mg/dL (ref 6–23)
CO2: 29 meq/L (ref 19–32)
CREATININE: 0.78 mg/dL (ref 0.40–1.20)
Calcium: 8.5 mg/dL (ref 8.4–10.5)
Chloride: 107 mEq/L (ref 96–112)
GFR: 89.01 mL/min (ref >60.00)
Glucose, Bld: 94 mg/dL (ref 70–99)
Potassium: 4.2 mEq/L (ref 3.5–5.1)
SODIUM: 142 meq/L (ref 135–145)
Total Protein: 6.8 g/dL (ref 6.0–8.3)

## 2016-05-02 LAB — CBC
HCT: 37.2 % (ref 36.0–46.0)
Hemoglobin: 12.6 g/dL (ref 12.0–15.0)
MCHC: 33.8 g/dL (ref 30.0–36.0)
MCV: 85.9 fl (ref 78.0–100.0)
Platelets: 303 10*3/uL (ref 150.0–400.0)
RBC: 4.33 Mil/uL (ref 3.87–5.11)
RDW: 14 % (ref 11.5–15.5)
WBC: 7.6 10*3/uL (ref 4.0–10.5)

## 2016-05-02 LAB — VITAMIN D 25 HYDROXY (VIT D DEFICIENCY, FRACTURES): VITD: 25.12 ng/mL — ABNORMAL LOW (ref 30.00–100.00)

## 2016-05-02 MED ORDER — TRIAMCINOLONE ACETONIDE 0.1 % EX CREA: TOPICAL_CREAM | CUTANEOUS | 0 refills | Status: DC
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
## 2016-05-02 NOTE — Assessment & Plan Note (Signed)
Pain mostly when hugged or pressure applied to any part of body. Mostly bothersome to ribs and hips. Family history of fibromyalgia. Since she has been for the past 6 months will check vitamin levels and other labs to rule out metabolic cause. Normal range of motion.

## 2016-05-02 NOTE — Assessment & Plan Note (Signed)
Weight loss of 16 pounds in the last several months after changes in diet. Recently started exercising, commended her on the success.

## 2016-05-02 NOTE — Progress Notes (Signed)
Pre visit review using our clinic review tool, if applicable. No additional management support is needed unless otherwise documented below in the visit note. 

## 2016-05-02 NOTE — Assessment & Plan Note (Signed)
Check CBC today.  

## 2016-05-02 NOTE — Assessment & Plan Note (Signed)
Located to bilateral knees for years. Prescription for triamcinolone cream sent to pharmacy for twice-daily application until resolved. She will update if no improvement.

## 2016-05-02 NOTE — Patient Instructions (Signed)
Complete lab work prior to leaving today. I will notify you of your results once received.   Congratulations on your weight loss, keep up the great work!  Please schedule a physical with me within the next 3-6 months. You may also schedule a lab only appointment 3-4 days prior. We will discuss your lab results in detail during your physical.  It was a pleasure to meet you today! Please don't hesitate to call me with any questions. Welcome to [facility]!

## 2016-05-19 ENCOUNTER — Telehealth: Payer: Self-pay

## 2016-05-19 NOTE — Telephone Encounter (Signed)
Pt states that both of her children have pinworms and their pediatrician prescribed medication for that but she advised them that everyone in the house needs to be treated for that, pt would like to know if you could call her in something or does she need to come in? "Also she said her husband doesn't have a PCP na d she's wondering if you could double the amount of medicine for her"

## 2016-05-20 DIAGNOSIS — F419 Anxiety disorder, unspecified: Secondary | ICD-10-CM | POA: Diagnosis not present

## 2016-05-20 NOTE — Telephone Encounter (Signed)
Spoken and notified patient of Kate's comments. Patient verbalized understanding.  Patient stated that she is having hard time finding a pharmacy that carries OTC treatment for pinworms. Patient stated she may call back if she can't find it.

## 2016-05-20 NOTE — Telephone Encounter (Signed)
Noted  

## 2016-05-20 NOTE — Telephone Encounter (Signed)
Please notify patient that I recommend she and her husband use the OTC treatment if they do NOT have pinworms. The prescription is very expensive can cost anywhere from $75-100 after insurance. This is the best and most cost effective way to prevent pinworms.

## 2016-06-24 DIAGNOSIS — F419 Anxiety disorder, unspecified: Secondary | ICD-10-CM | POA: Diagnosis not present

## 2016-07-29 DIAGNOSIS — F419 Anxiety disorder, unspecified: Secondary | ICD-10-CM | POA: Diagnosis not present

## 2016-08-03 ENCOUNTER — Emergency Department: Payer: BLUE CROSS/BLUE SHIELD

## 2016-08-03 ENCOUNTER — Encounter: Payer: Self-pay | Admitting: Emergency Medicine

## 2016-08-03 ENCOUNTER — Emergency Department
Admission: EM | Admit: 2016-08-03 | Discharge: 2016-08-03 | Disposition: A | Discharge status: Home / Self Care | Payer: BLUE CROSS/BLUE SHIELD | Attending: Student in an Organized Health Care Education/Training Program | Admitting: Student in an Organized Health Care Education/Training Program

## 2016-08-03 DIAGNOSIS — S3992XA Unspecified injury of lower back, initial encounter: Secondary | ICD-10-CM | POA: Diagnosis not present

## 2016-08-03 DIAGNOSIS — S52572A Other intraarticular fracture of lower end of left radius, initial encounter for closed fracture: Secondary | ICD-10-CM | POA: Diagnosis not present

## 2016-08-03 DIAGNOSIS — Y9351 Activity, roller skating (inline) and skateboarding: Secondary | ICD-10-CM | POA: Diagnosis not present

## 2016-08-03 DIAGNOSIS — S52571A Other intraarticular fracture of lower end of right radius, initial encounter for closed fracture: Secondary | ICD-10-CM | POA: Insufficient documentation

## 2016-08-03 DIAGNOSIS — M545 Low back pain: Secondary | ICD-10-CM | POA: Diagnosis not present

## 2016-08-03 DIAGNOSIS — Y929 Unspecified place or not applicable: Secondary | ICD-10-CM | POA: Diagnosis not present

## 2016-08-03 DIAGNOSIS — S6991XA Unspecified injury of right wrist, hand and finger(s), initial encounter: Secondary | ICD-10-CM | POA: Diagnosis present

## 2016-08-03 DIAGNOSIS — S52501A Unspecified fracture of the lower end of right radius, initial encounter for closed fracture: Secondary | ICD-10-CM | POA: Diagnosis not present

## 2016-08-03 DIAGNOSIS — Y999 Unspecified external cause status: Secondary | ICD-10-CM | POA: Insufficient documentation

## 2016-08-03 DIAGNOSIS — S52502A Unspecified fracture of the lower end of left radius, initial encounter for closed fracture: Secondary | ICD-10-CM | POA: Diagnosis not present

## 2016-08-03 MED ORDER — ONDANSETRON 4 MG PO TBDP
ORAL_TABLET | ORAL | Status: AC
  Filled 2016-08-03: qty 1

## 2016-08-03 MED ORDER — ONDANSETRON 4 MG PO TBDP
4.0000 mg | ORAL_TABLET | Freq: Once | ORAL | Status: AC
  Administered 2016-08-03: 4 mg via ORAL

## 2016-08-03 MED ORDER — OXYCODONE-ACETAMINOPHEN 7.5-325 MG PO TABS
1.0000 | ORAL_TABLET | Freq: Once | ORAL | Status: AC
  Administered 2016-08-03: 1 via ORAL
  Filled 2016-08-03: qty 1

## 2016-08-03 MED ORDER — OXYCODONE-ACETAMINOPHEN 7.5-325 MG PO TABS: 1.0000 | ORAL_TABLET | Freq: Three times a day (TID) | ORAL | 0 refills | Status: DC | PRN

## 2016-08-03 MED ORDER — KETOROLAC TROMETHAMINE 60 MG/2ML IM SOLN
60.0000 mg | Freq: Once | INTRAMUSCULAR | Status: AC
  Administered 2016-08-03: 60 mg via INTRAMUSCULAR
  Filled 2016-08-03: qty 2

## 2016-08-03 NOTE — ED Notes (Signed)
Pt is refusing to give a urine preg  States no way she could be preg  Has had both tubes removed  Last period was 11/28 and states she will sign a release

## 2016-08-03 NOTE — ED Provider Notes (Signed)
[facility] Emergency Department Provider Note ____________________________________________  Time seen: Approximately 4:51 PM  I have reviewed the triage vital signs and the nursing notes.   HISTORY  Chief Complaint Fall   HPI Wendy Chapman is a 35 y.o. female presenting after a fall today where she experienced bilateral hyperextension of the wrist while roller skating. Patient's wrist pain is currently 10 out of 10 in intensity and described as aching. Patient also sustained a sacral injury. Patient rates sacral pain as 6/10 in intensity. Patient does not have a history of upper extremity or back surgery. She is experiencing upper extremity avoidance. She has not attempted alleviating measures. Patient has five children and works in Community education officerinsurance. Patient is right handed.   Past Medical History:  Diagnosis Date  . Chickenpox   . Depression   . Eczema   . Gestational hypertension   . Pyelonephritis     Patient Active Problem List   Diagnosis Date Noted  . History of anemia 05/02/2016  . Complaints of total body pain 05/02/2016  . Eczema 05/02/2016  . Obesity (BMI 35.0-39.9 without comorbidity) 05/02/2016    Past Surgical History:  Procedure Laterality Date  . DILATION AND CURETTAGE OF UTERUS    . TONSILLECTOMY    . TONSILLECTOMY AND ADENOIDECTOMY  1996  . TUBAL LIGATION Bilateral 05/10/2014   Procedure: POST PARTUM TUBAL LIGATION;  Surgeon: Kirkland HunArthur Stringer, MD;  Location: [facility];  Service: Gynecology;  Laterality: Bilateral;  . WISDOM TOOTH EXTRACTION  1997    Prior to Admission medications   Medication Sig Start Date End Date Taking? Authorizing Provider  oxyCODONE-acetaminophen (PERCOCET) 7.5-325 MG tablet Take 1 tablet by mouth every 8 (eight) hours as needed for severe pain. 08/03/16   Orvil FeilJaclyn M Farida Mcreynolds, PA-C  Prenatal Vit-Fe Fumarate-FA (PRENATAL MULTIVITAMIN) TABS tablet Take 1 tablet by mouth daily at 12 noon.    Historical Provider, MD   triamcinolone cream (KENALOG) 0.1 % Apply to affected area twice daily x 1 week. May also use as needed. 05/02/16   Doreene NestKatherine K Clark, NP    Allergies Penicillins  Family History  Problem Relation Age of Onset  . Arthritis Mother   . Mental illness Mother   . Fibromyalgia Mother   . Heart disease Paternal Grandmother   . Stroke Paternal Grandmother   . Arthritis Paternal Grandfather   . Hyperlipidemia Paternal Grandfather   . Hypertension Paternal Grandfather   . Mental illness Maternal Grandmother     Social History Social History  Substance Use Topics  . Smoking status: Never Smoker  . Smokeless tobacco: Never Used  . Alcohol use No    Review of Systems Constitutional: No recent illness. Cardiovascular: Denies chest pain or palpitations. Respiratory: Denies shortness of breath. Musculoskeletal: Pain in wrists bilaterally and sacrum. Skin: Negative for rash, wound, lesion. Neurological: Negative for focal weakness or numbness.  ____________________________________________   PHYSICAL EXAM:  VITAL SIGNS: ED Triage Vitals  Enc Vitals Group     BP 08/03/16 1638 (!) 106/55     Pulse Rate 08/03/16 1638 60     Resp 08/03/16 1638 18     Temp 08/03/16 1638 98.5 F (36.9 C)     Temp Source 08/03/16 1638 Oral     SpO2 08/03/16 1638 100 %     Weight 08/03/16 1638 235 lb (106.6 kg)     Height 08/03/16 1638 5\' 9"  (1.753 m)     Head Circumference --      Peak Flow --  Pain Score 08/03/16 1644 6     Pain Loc --      Pain Edu? --      Excl. in GC? --     Constitutional: Alert and oriented. Well appearing and in no acute distress. Eyes: Conjunctivae are normal. EOMI. Head: Atraumatic. Neck: No stridor.  Respiratory: Normal respiratory effort.   Musculoskeletal: Patient has 5/5 strength in the upper and lower extremities bilaterally. Patient is unable to perform flexion and extension at the wrist bilaterally. Patient has pain to palpation along wrist and distal  forearm bilaterally. Palpable radial and ulnar pulses bilaterally. Full range of motion at the hip, knee and ankle bilaterally. Patient is able to ambulate but has pain.   Neurologic:  Normal speech and language. No gross focal neurologic deficits are appreciated. Cranial nerves: 2-10 normal as tested. Cerebellar: Finger-nose-finger  Sensorimotor: No sensory loss or abnormal reflexes. Vision: No visual field deficts noted to confrontation.  Speech: No dysarthria or expressive aphasia.  Skin:  Skin overlying wrists is edematous bilaterally. No bruising or lacerations visualized.  Psychiatric: Mood and affect are normal. Speech and behavior are normal.  ____________________________________________   LABS (all labs ordered are listed, but only abnormal results are displayed)  Labs Reviewed - No data to display ____________________________________________  RADIOLOGY  I, Orvil FeilJaclyn M Jerod Mcquain, personally viewed and evaluated these images (plain radiographs) as part of my medical decision making, as well as reviewing the written report by the radiologist.  No evidence of acute lumbar spine injury. Mild scoliosis. Minimally displaced intra-articular fracture distal radius, left.  Nondisplaced intra-articular fracture of the distal radius, right.   DG Lumbar Spine  DG Forearm Left DG Forearm Right DG Hand Complete Right  DG Hand Complete Left    PROCEDURES  Procedure(s) performed: Toradol    ____________________________________________   INITIAL IMPRESSION / ASSESSMENT AND PLAN / ED COURSE  Clinical Course     Pertinent labs & imaging results that were available during my care of the patient were reviewed by me and considered in my medical decision making (see chart for details).  Assessment and plan: Patient has a minimally displaced intra-articular fracture of the distal radius, left. Patient has a nondisplaced intra-articular fracture of the distal radius, right. Patient was  given Percocet and Toradol in the emergency department. Patient was discharged with a 2 day course of Percocet to be used as needed for pain. Patient was advised to seek care with orthopedics on Monday. Patient was referred to Dr. Lyman BishopPeter Dalldorff at her request. Patient was advised not to breast-feed with Percocet use. Child is 35 years old, so refraining from breast-feeding will not be problematic. Vital signs are reassuring at this time. All patient questions were answered.  ____________________________________________   FINAL CLINICAL IMPRESSION(S) / ED DIAGNOSES  Final diagnoses:  Closed fracture of distal end of left radius, unspecified fracture morphology, initial encounter  Closed fracture of distal end of right radius, unspecified fracture morphology, initial encounter       Orvil FeilJaclyn M Kell Ferris, PA-C 08/03/16 1841    Willy EddyPatrick Robinson, MD 08/03/16 2326

## 2016-08-03 NOTE — ED Triage Notes (Signed)
States she was skating backwards and fell  Landed on tailbone and then put her arms back ..landed on both wrist  Positive swelling noted to both wrists   Good pulses noted   Also having increased pain to tailbone

## 2016-08-06 DIAGNOSIS — S62101A Fracture of unspecified carpal bone, right wrist, initial encounter for closed fracture: Secondary | ICD-10-CM | POA: Diagnosis not present

## 2016-08-06 DIAGNOSIS — S62102A Fracture of unspecified carpal bone, left wrist, initial encounter for closed fracture: Secondary | ICD-10-CM | POA: Diagnosis not present

## 2016-08-22 DIAGNOSIS — S62101A Fracture of unspecified carpal bone, right wrist, initial encounter for closed fracture: Secondary | ICD-10-CM | POA: Diagnosis not present

## 2016-08-22 DIAGNOSIS — S52502D Unspecified fracture of the lower end of left radius, subsequent encounter for closed fracture with routine healing: Secondary | ICD-10-CM | POA: Diagnosis not present

## 2016-09-12 DIAGNOSIS — S52502D Unspecified fracture of the lower end of left radius, subsequent encounter for closed fracture with routine healing: Secondary | ICD-10-CM | POA: Diagnosis not present

## 2016-09-12 DIAGNOSIS — S62101A Fracture of unspecified carpal bone, right wrist, initial encounter for closed fracture: Secondary | ICD-10-CM | POA: Diagnosis not present

## 2016-09-22 DIAGNOSIS — F419 Anxiety disorder, unspecified: Secondary | ICD-10-CM | POA: Diagnosis not present

## 2016-10-03 DIAGNOSIS — S52502D Unspecified fracture of the lower end of left radius, subsequent encounter for closed fracture with routine healing: Secondary | ICD-10-CM | POA: Diagnosis not present

## 2016-10-03 DIAGNOSIS — S62101A Fracture of unspecified carpal bone, right wrist, initial encounter for closed fracture: Secondary | ICD-10-CM | POA: Diagnosis not present

## 2016-10-28 ENCOUNTER — Ambulatory Visit (INDEPENDENT_AMBULATORY_CARE_PROVIDER_SITE_OTHER): Payer: BLUE CROSS/BLUE SHIELD | Admitting: Primary Care

## 2016-10-28 ENCOUNTER — Ambulatory Visit: Payer: BLUE CROSS/BLUE SHIELD | Admitting: Family Medicine

## 2016-10-28 ENCOUNTER — Encounter: Payer: Self-pay | Admitting: Primary Care

## 2016-10-28 VITALS — BP 130/82 | HR 61 | Temp 98.3°F | Ht 69.0 in | Wt 230.1 lb

## 2016-10-28 DIAGNOSIS — J069 Acute upper respiratory infection, unspecified: Secondary | ICD-10-CM | POA: Diagnosis not present

## 2016-10-28 MED ORDER — BENZONATATE 200 MG PO CAPS: 200.0000 mg | ORAL_CAPSULE | Freq: Three times a day (TID) | ORAL | 0 refills | Status: DC | PRN

## 2016-10-28 NOTE — Patient Instructions (Signed)
Your symptoms are representative of a viral illness which will resolve on its own over time. Our goal is to treat your symptoms in order to aid your body in the healing process and to make you more comfortable.   You may take Benzonatate capsules for cough. Take 1 capsule by mouth three times daily as needed for cough.  Nasal Congestion/Ear Pressure/Sinus Pressure: Try using Flonase (fluticasone) nasal spray. Instill 1 spray in each nostril twice daily.   Drainage/Ear Fullness: You could try taking Zyrtec at bedtime. This will help to dry up the sinuses and the drainage down your throat.   Ensure you are staying hydrated and rest. Please call us if you have no improvement in symptoms by Monday next week.  It was a pleasure to see you today!   Upper Respiratory Infection, Adult Most upper respiratory infections (URIs) are a viral infection of the air passages leading to the lungs. A URI affects the nose, throat, and upper air passages. The most common type of URI is nasopharyngitis and is typically referred to as "the common cold." URIs run their course and usually go away on their own. Most of the time, a URI does not require medical attention, but sometimes a bacterial infection in the upper airways can follow a viral infection. This is called a secondary infection. Sinus and middle ear infections are common types of secondary upper respiratory infections. Bacterial pneumonia can also complicate a URI. A URI can worsen asthma and chronic obstructive pulmonary disease (COPD). Sometimes, these complications can require emergency medical care and may be life threatening. What are the causes? Almost all URIs are caused by viruses. A virus is a type of germ and can spread from one person to another. What increases the risk? You may be at risk for a URI if:  You smoke.  You have chronic heart or lung disease.  You have a weakened defense (immune) system.  You are very young or very old.  You  have nasal allergies or asthma.  You work in crowded or poorly ventilated areas.  You work in health care facilities or schools. What are the signs or symptoms? Symptoms typically develop 2-3 days after you come in contact with a cold virus. Most viral URIs last 7-10 days. However, viral URIs from the influenza virus (flu virus) can last 14-18 days and are typically more severe. Symptoms may include:  Runny or stuffy (congested) nose.  Sneezing.  Cough.  Sore throat.  Headache.  Fatigue.  Fever.  Loss of appetite.  Pain in your forehead, behind your eyes, and over your cheekbones (sinus pain).  Muscle aches. How is this diagnosed? Your health care provider may diagnose a URI by:  Physical exam.  Tests to check that your symptoms are not due to another condition such as:  Strep throat.  Sinusitis.  Pneumonia.  Asthma. How is this treated? A URI goes away on its own with time. It cannot be cured with medicines, but medicines may be prescribed or recommended to relieve symptoms. Medicines may help:  Reduce your fever.  Reduce your cough.  Relieve nasal congestion. Follow these instructions at home:  Take medicines only as directed by your health care provider.  Gargle warm saltwater or take cough drops to comfort your throat as directed by your health care provider.  Use a warm mist humidifier or inhale steam from a shower to increase air moisture. This may make it easier to breathe.  Drink enough fluid to keep your  urine clear or pale yellow.  Eat soups and other clear broths and maintain good nutrition.  Rest as needed.  Return to work when your temperature has returned to normal or as your health care provider advises. You may need to stay home longer to avoid infecting others. You can also use a face mask and careful hand washing to prevent spread of the virus.  Increase the usage of your inhaler if you have asthma.  Do not use any tobacco products,  including cigarettes, chewing tobacco, or electronic cigarettes. If you need help quitting, ask your health care provider. How is this prevented? The best way to protect yourself from getting a cold is to practice good hygiene.  Avoid oral or hand contact with people with cold symptoms.  Wash your hands often if contact occurs. There is no clear evidence that vitamin C, vitamin E, echinacea, or exercise reduces the chance of developing a cold. However, it is always recommended to get plenty of rest, exercise, and practice good nutrition. Contact a health care provider if:  You are getting worse rather than better.  Your symptoms are not controlled by medicine.  You have chills.  You have worsening shortness of breath.  You have brown or red mucus.  You have yellow or brown nasal discharge.  You have pain in your face, especially when you bend forward.  You have a fever.  You have swollen neck glands.  You have pain while swallowing.  You have white areas in the back of your throat. Get help right away if:  You have severe or persistent:  Headache.  Ear pain.  Sinus pain.  Chest pain.  You have chronic lung disease and any of the following:  Wheezing.  Prolonged cough.  Coughing up blood.  A change in your usual mucus.  You have a stiff neck.  You have changes in your:  Vision.  Hearing.  Thinking.  Mood. This information is not intended to replace advice given to you by your health care provider. Make sure you discuss any questions you have with your health care provider. Document Released: 01/28/2001 Document Revised: 04/06/2016 Document Reviewed: 11/09/2013 Elsevier Interactive Patient Education  2017 [employer].

## 2016-10-28 NOTE — Progress Notes (Signed)
Subjective:    Patient ID: Wendy Chapman, female    DOB: 23-Jan-1981, 36 y.o.   MRN: [id number]  HPI  Wendy Chapman is a 36 year old female with a chief complaint of cough. She also reports ear pain/fullness, sore throat, nasal congestion, sinus pressure, post nasal drip, chills. Her symptoms began 3 days ago. Her cough is productive with green sputum. She denies fevers, nausea, abdominal pain. She's taking Mucinex DM and Ibuprofen without much improvement. Overall she's feeling about the same.   Review of Systems  Constitutional: Positive for chills and fatigue. Negative for fever.  HENT: Positive for congestion, ear pain, sinus pressure and sore throat.   Respiratory: Positive for cough. Negative for wheezing.        Past Medical History:  Diagnosis Date  . Chickenpox   . Depression   . Eczema   . Gestational hypertension   . Pyelonephritis      Social History   Social History  . Marital status: Married    Spouse name: N/A  . Number of children: N/A  . Years of education: N/A   Occupational History  . Not on file.   Social History Main Topics  . Smoking status: Never Smoker  . Smokeless tobacco: Never Used  . Alcohol use No  . Drug use: No  . Sexual activity: Yes   Other Topics Concern  . Not on file   Social History Narrative   Married.   5 children.    Works as an Advertising account plannerinsurance agent.   Enjoys hiking, book club, working out.     Past Surgical History:  Procedure Laterality Date  . DILATION AND CURETTAGE OF UTERUS    . TONSILLECTOMY    . TONSILLECTOMY AND ADENOIDECTOMY  1996  . TUBAL LIGATION Bilateral 05/10/2014   Procedure: POST PARTUM TUBAL LIGATION;  Surgeon: Kirkland HunArthur Stringer, MD;  Location: [facility];  Service: Gynecology;  Laterality: Bilateral;  . WISDOM TOOTH EXTRACTION  1997    Family History  Problem Relation Age of Onset  . Arthritis Mother   . Mental illness Mother   . Fibromyalgia Mother   . Heart disease Paternal Grandmother   . Stroke  Paternal Grandmother   . Arthritis Paternal Grandfather   . Hyperlipidemia Paternal Grandfather   . Hypertension Paternal Grandfather   . Mental illness Maternal Grandmother     Allergies  Allergen Reactions  . Penicillins Hives    CAN TAKE KEFLEX    Current Outpatient Prescriptions on File Prior to Visit  Medication Sig Dispense Refill  . Prenatal Vit-Fe Fumarate-FA (PRENATAL MULTIVITAMIN) TABS tablet Take 1 tablet by mouth daily at 12 noon.     No current facility-administered medications on file prior to visit.     BP 130/82   Pulse 61   Temp 98.3 F (36.8 C) (Oral)   Ht 5\' 9"  (1.753 m)   Wt 230 lb 1.9 oz (104.4 kg)   LMP 10/15/2016   SpO2 98%   BMI 33.98 kg/m    Objective:   Physical Exam  Constitutional: She appears well-nourished.  HENT:  Right Ear: Tympanic membrane and ear canal normal.  Left Ear: Tympanic membrane and ear canal normal.  Nose: Mucosal edema present. Right sinus exhibits maxillary sinus tenderness and frontal sinus tenderness. Left sinus exhibits maxillary sinus tenderness and frontal sinus tenderness.  Mouth/Throat: Oropharynx is clear and moist.  Eyes: Conjunctivae are normal.  Neck: Neck supple.  Cardiovascular: Normal rate and regular rhythm.   Pulmonary/Chest: Effort  normal and breath sounds normal. She has no wheezes. She has no rales.  Lymphadenopathy:    She has no cervical adenopathy.  Skin: Skin is warm and dry.          Assessment & Plan:  URI:  Cough, sinus pressure, nasal congestion x 3 days. Little improvement with OTC treatment. Exam today with clear lungs, stable vitals, sinus tenderness. She does not appear acutely sickly/ill. Suspect viral URI and will treat with conservative measures. Rx for Tessalon pearls sent to pharmacy. Discussed Flonase, Zyrtec. Fluids, rest, follow up PRN.  Morrie Sheldon, NP

## 2016-10-28 NOTE — Progress Notes (Signed)
Pre visit review using our clinic review tool, if applicable. No additional management support is needed unless otherwise documented below in the visit note. 

## 2016-10-29 DIAGNOSIS — F419 Anxiety disorder, unspecified: Secondary | ICD-10-CM | POA: Diagnosis not present

## 2016-11-25 DIAGNOSIS — F419 Anxiety disorder, unspecified: Secondary | ICD-10-CM | POA: Diagnosis not present

## 2016-12-01 DIAGNOSIS — Z6833 Body mass index (BMI) 33.0-33.9, adult: Secondary | ICD-10-CM | POA: Diagnosis not present

## 2016-12-01 DIAGNOSIS — Z124 Encounter for screening for malignant neoplasm of cervix: Secondary | ICD-10-CM | POA: Diagnosis not present

## 2016-12-01 DIAGNOSIS — Z01419 Encounter for gynecological examination (general) (routine) without abnormal findings: Secondary | ICD-10-CM | POA: Diagnosis not present

## 2016-12-03 DIAGNOSIS — M25532 Pain in left wrist: Secondary | ICD-10-CM | POA: Diagnosis not present

## 2016-12-03 DIAGNOSIS — M25531 Pain in right wrist: Secondary | ICD-10-CM | POA: Diagnosis not present

## 2016-12-23 DIAGNOSIS — F419 Anxiety disorder, unspecified: Secondary | ICD-10-CM | POA: Diagnosis not present

## 2017-01-27 DIAGNOSIS — F419 Anxiety disorder, unspecified: Secondary | ICD-10-CM | POA: Diagnosis not present

## 2017-02-20 ENCOUNTER — Encounter: Payer: Self-pay | Admitting: Family Medicine

## 2017-02-20 ENCOUNTER — Ambulatory Visit (INDEPENDENT_AMBULATORY_CARE_PROVIDER_SITE_OTHER): Payer: BLUE CROSS/BLUE SHIELD | Admitting: Family Medicine

## 2017-02-20 VITALS — BP 100/72 | HR 55 | Resp 12 | Ht 69.0 in | Wt 209.0 lb

## 2017-02-20 DIAGNOSIS — J988 Other specified respiratory disorders: Secondary | ICD-10-CM | POA: Diagnosis not present

## 2017-02-20 DIAGNOSIS — R05 Cough: Secondary | ICD-10-CM

## 2017-02-20 DIAGNOSIS — R059 Cough, unspecified: Secondary | ICD-10-CM

## 2017-02-20 MED ORDER — DOXYCYCLINE HYCLATE 100 MG PO TABS: 100.0000 mg | ORAL_TABLET | Freq: Two times a day (BID) | ORAL | 0 refills | Status: AC

## 2017-02-20 MED ORDER — BENZONATATE 100 MG PO CAPS: 200.0000 mg | ORAL_CAPSULE | Freq: Two times a day (BID) | ORAL | 0 refills | Status: AC | PRN

## 2017-02-20 NOTE — Progress Notes (Signed)
ACUTE VISIT  HPI:  Chief Complaint  Patient presents with  . Cough    Wendy Chapman is a 36 y.o.female here today complaining of 2 weeks of respiratory symptoms.  Productive cough with light greenish sputum and intermittent dysphonia. Sore throat attributed to persistent post nasal drainage. Symptoms are worse in the morning.  Cough  This is a new problem. The current episode started 1 to 4 weeks ago. The problem has been unchanged. The cough is productive of sputum. Associated symptoms include ear pain, nasal congestion, postnasal drip, rhinorrhea and a sore throat. Pertinent negatives include no chills, eye redness, fever, headaches, heartburn, hemoptysis, myalgias, rash, shortness of breath, sweats, weight loss or wheezing. Risk factors: No identified exacerbating or alleviating factors. She has tried OTC cough suppressant for the symptoms. The treatment provided no relief. There is no history of asthma or environmental allergies.   Cough keeps her from sleep.  No Hx of recent travel. + Sick contact, her son was sick with similar symptoms and recently Dx with OM.  + Tick bite a few weeks ago. No skin rash or arthralgias.  Hx of allergies: Denies but eczema listed on problem list.    Review of Systems  Constitutional: Positive for fatigue. Negative for activity change, appetite change, chills, fever and weight loss.  HENT: Positive for congestion, ear pain, postnasal drip, rhinorrhea, sinus pressure, sore throat and voice change. Negative for ear discharge, facial swelling, mouth sores, nosebleeds, sinus pain and trouble swallowing.   Eyes: Negative for redness and itching.  Respiratory: Positive for cough. Negative for hemoptysis, chest tightness, shortness of breath and wheezing.   Gastrointestinal: Negative for abdominal pain, heartburn, nausea and vomiting.  Musculoskeletal: Negative for myalgias.  Skin: Negative for rash.  Allergic/Immunologic:  Negative for environmental allergies.  Neurological: Negative for weakness and headaches.  Hematological: Negative for adenopathy. Does not bruise/bleed easily.  Psychiatric/Behavioral: Positive for sleep disturbance. Negative for confusion.      Current Outpatient Prescriptions on File Prior to Visit  Medication Sig Dispense Refill  . Prenatal Vit-Fe Fumarate-FA (PRENATAL MULTIVITAMIN) TABS tablet Take 1 tablet by mouth daily at 12 noon.     No current facility-administered medications on file prior to visit.      Past Medical History:  Diagnosis Date  . Chickenpox   . Depression   . Eczema   . Gestational hypertension   . Pyelonephritis    Allergies  Allergen Reactions  . Penicillins Hives    CAN TAKE KEFLEX    Social History   Social History  . Marital status: Married    Spouse name: N/A  . Number of children: N/A  . Years of education: N/A   Social History Main Topics  . Smoking status: Never Smoker  . Smokeless tobacco: Never Used  . Alcohol use No  . Drug use: No  . Sexual activity: Yes   Other Topics Concern  . None   Social History Narrative   Married.   5 children.    Works as an Advertising account plannerinsurance agent.   Enjoys hiking, book club, working out.     Vitals:   02/20/17 1529  BP: 100/72  Pulse: (!) 55  Resp: 12  O2 sat at RA 98% Body mass index is 30.86 kg/m.   Physical Exam  Nursing note and vitals reviewed. Constitutional: She is oriented to person, place, and time. She appears well-developed. She does not appear ill. No distress.  HENT:  Head: Atraumatic.  Right Ear: Tympanic membrane, external ear and ear canal normal.  Left Ear: Tympanic membrane, external ear and ear canal normal.  Nose: Rhinorrhea present.  Mouth/Throat: Oropharynx is clear and moist and mucous membranes are normal.  Mild dysphonia. Mild tenderness upon pressure of maxillary and frontal sinuses.  Eyes: Conjunctivae and EOM are normal.  Neck: No muscular tenderness  present.  Cardiovascular: Normal rate and regular rhythm.   No murmur heard. HR 60/min by my count.  Respiratory: Effort normal and breath sounds normal. No respiratory distress. She has no wheezes. She has no rales.  Mild course respiratory noises on left base.  Lymphadenopathy:       Head (right side): No submandibular adenopathy present.       Head (left side): No submandibular adenopathy present.    She has no cervical adenopathy.  Neurological: She is alert and oriented to person, place, and time. She has normal strength. Gait normal.  Skin: Skin is warm. No rash noted. No erythema.  Psychiatric: She has a normal mood and affect. Her speech is normal.  Well groomed, good eye contact.    ASSESSMENT AND PLAN:    Wendy Chapman was seen today for cough.  Diagnoses and all orders for this visit:  Respiratory tract infection  Explained that cough and congestion can last a few days and even weeks. Empiric abx treatment recommended. Voice rest recommended for dysphonia. Further recommendations will be given according to CXR report.  -     doxycycline (VIBRA-TABS) 100 MG tablet; Take 1 tablet (100 mg total) by mouth 2 (two) times daily.  Cough  Possible causes discussed: Residual symptoms s/p URI, active infectious process,allergies,and GERD among some. OTC Mucinex may help. She can try Zantac 150 mg at bedtime if not better in a couple days. F/U with PCP as needed.  -     DG Chest 2 View; Future -     benzonatate (TESSALON) 100 MG capsule; Take 2 capsules (200 mg total) by mouth 2 (two) times daily as needed for cough.     -[name] was advised to seek attention immediately if symptoms worsen or to follow if they persist or new concerns arise.       Dany Walther G. [place], MD  [facility]. [place] office.

## 2017-02-20 NOTE — Patient Instructions (Signed)
  Ms.Wendy Chapman I have seen you today for an acute visit.  A few things to remember from today's visit:   Respiratory tract infection - Plan: doxycycline (VIBRA-TABS) 100 MG tablet  Cough - Plan: DG Chest 2 View, benzonatate (TESSALON) 100 MG capsule     Over the counter medications as decongestants and cold medications usually help, they need to be taken with caution if there is a history of high blood pressure or palpitations. Plain Mucinex may also help.  Plenty of fluids. Honey helps with cough. Steam inhalations helps with runny nose, nasal congestion, and may prevent sinus infections. Cough and nasal congestion could last a few days and sometimes weeks after upper respiratory infection.. Please follow in not any better in 2 weeks or if symptoms get worse.    Medications prescribed today are intended for short period of time and will not be refill upon request, a follow up appointment might be necessary to discuss continuation of of treatment if appropriate.  Today X ray was ordered.  This [facility] between 8 am and 5 pm: [address] [phone].     In general please monitor for signs of worsening symptoms and seek immediate medical attention if any concerning.

## 2017-02-23 ENCOUNTER — Ambulatory Visit: Payer: BLUE CROSS/BLUE SHIELD | Admitting: Family Medicine

## 2017-02-23 ENCOUNTER — Ambulatory Visit (INDEPENDENT_AMBULATORY_CARE_PROVIDER_SITE_OTHER)
Admission: RE | Admit: 2017-02-23 | Discharge: 2017-02-23 | Disposition: A | Discharge status: Home / Self Care | Payer: BLUE CROSS/BLUE SHIELD | Source: Ambulatory Visit | Attending: Family Medicine | Admitting: Family Medicine

## 2017-02-23 DIAGNOSIS — R059 Cough, unspecified: Secondary | ICD-10-CM

## 2017-02-23 DIAGNOSIS — R05 Cough: Secondary | ICD-10-CM | POA: Diagnosis not present

## 2017-03-13 DIAGNOSIS — F419 Anxiety disorder, unspecified: Secondary | ICD-10-CM | POA: Diagnosis not present

## 2017-04-03 DIAGNOSIS — F419 Anxiety disorder, unspecified: Secondary | ICD-10-CM | POA: Diagnosis not present

## 2017-05-11 DIAGNOSIS — Z23 Encounter for immunization: Secondary | ICD-10-CM | POA: Diagnosis not present

## 2017-05-29 DIAGNOSIS — F419 Anxiety disorder, unspecified: Secondary | ICD-10-CM | POA: Diagnosis not present

## 2017-07-08 DIAGNOSIS — F419 Anxiety disorder, unspecified: Secondary | ICD-10-CM | POA: Diagnosis not present

## 2017-08-07 DIAGNOSIS — F419 Anxiety disorder, unspecified: Secondary | ICD-10-CM | POA: Diagnosis not present

## 2017-08-24 DIAGNOSIS — F419 Anxiety disorder, unspecified: Secondary | ICD-10-CM | POA: Diagnosis not present

## 2017-09-14 DIAGNOSIS — F419 Anxiety disorder, unspecified: Secondary | ICD-10-CM | POA: Diagnosis not present

## 2017-10-05 DIAGNOSIS — F419 Anxiety disorder, unspecified: Secondary | ICD-10-CM | POA: Diagnosis not present

## 2017-10-23 DIAGNOSIS — F419 Anxiety disorder, unspecified: Secondary | ICD-10-CM | POA: Diagnosis not present

## 2017-11-06 DIAGNOSIS — F419 Anxiety disorder, unspecified: Secondary | ICD-10-CM | POA: Diagnosis not present

## 2017-11-13 DIAGNOSIS — F419 Anxiety disorder, unspecified: Secondary | ICD-10-CM | POA: Diagnosis not present

## 2017-11-20 DIAGNOSIS — F419 Anxiety disorder, unspecified: Secondary | ICD-10-CM | POA: Diagnosis not present

## 2017-11-27 DIAGNOSIS — F419 Anxiety disorder, unspecified: Secondary | ICD-10-CM | POA: Diagnosis not present

## 2017-12-04 DIAGNOSIS — S91202A Unspecified open wound of left great toe with damage to nail, initial encounter: Secondary | ICD-10-CM | POA: Diagnosis not present

## 2017-12-10 DIAGNOSIS — F419 Anxiety disorder, unspecified: Secondary | ICD-10-CM | POA: Diagnosis not present

## 2017-12-15 DIAGNOSIS — F419 Anxiety disorder, unspecified: Secondary | ICD-10-CM | POA: Diagnosis not present

## 2017-12-18 DIAGNOSIS — F419 Anxiety disorder, unspecified: Secondary | ICD-10-CM | POA: Diagnosis not present

## 2017-12-22 ENCOUNTER — Ambulatory Visit: Payer: BLUE CROSS/BLUE SHIELD | Admitting: Psychology

## 2017-12-22 DIAGNOSIS — F4321 Adjustment disorder with depressed mood: Secondary | ICD-10-CM | POA: Diagnosis not present

## 2017-12-23 DIAGNOSIS — F419 Anxiety disorder, unspecified: Secondary | ICD-10-CM | POA: Diagnosis not present

## 2017-12-25 IMAGING — CR DG HAND COMPLETE 3+V*R*
1 series · 3 of 3 positions shown · non-contrast
Comparison: None.

CLINICAL DATA: Fall today. Bilateral wrist pain with limited range
of motion. Low back pain.

EXAM:
RIGHT HAND - COMPLETE 3+ VIEW

[Series 1: dg hand complete right · 0.14mm/px · 3 of 3 slices shown]
[im 1/3]
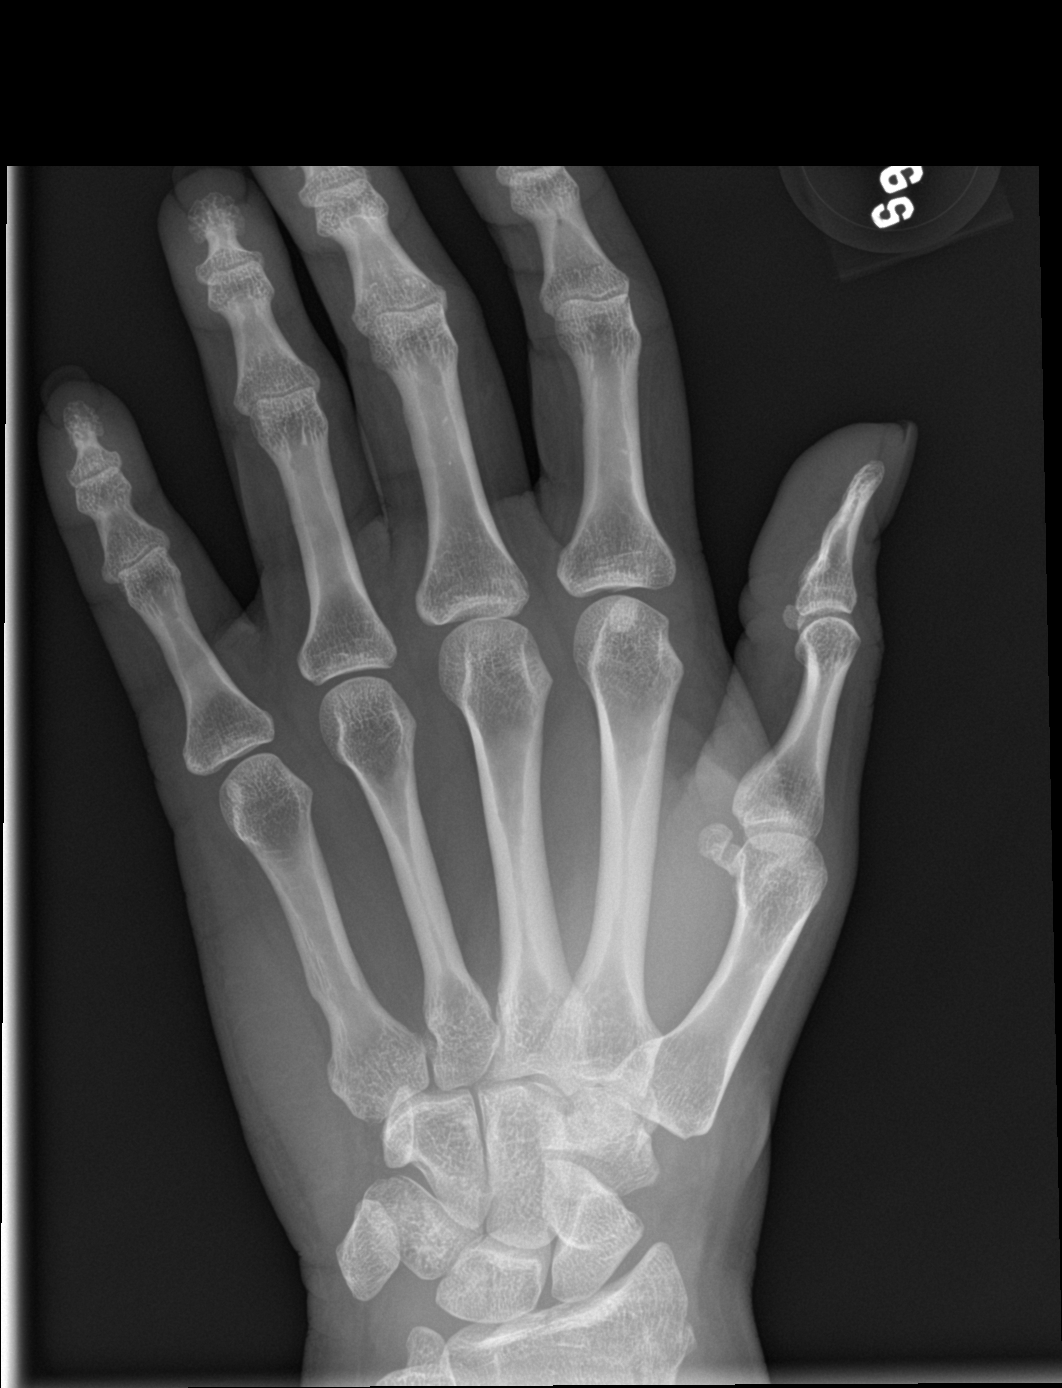
[im 2/3]
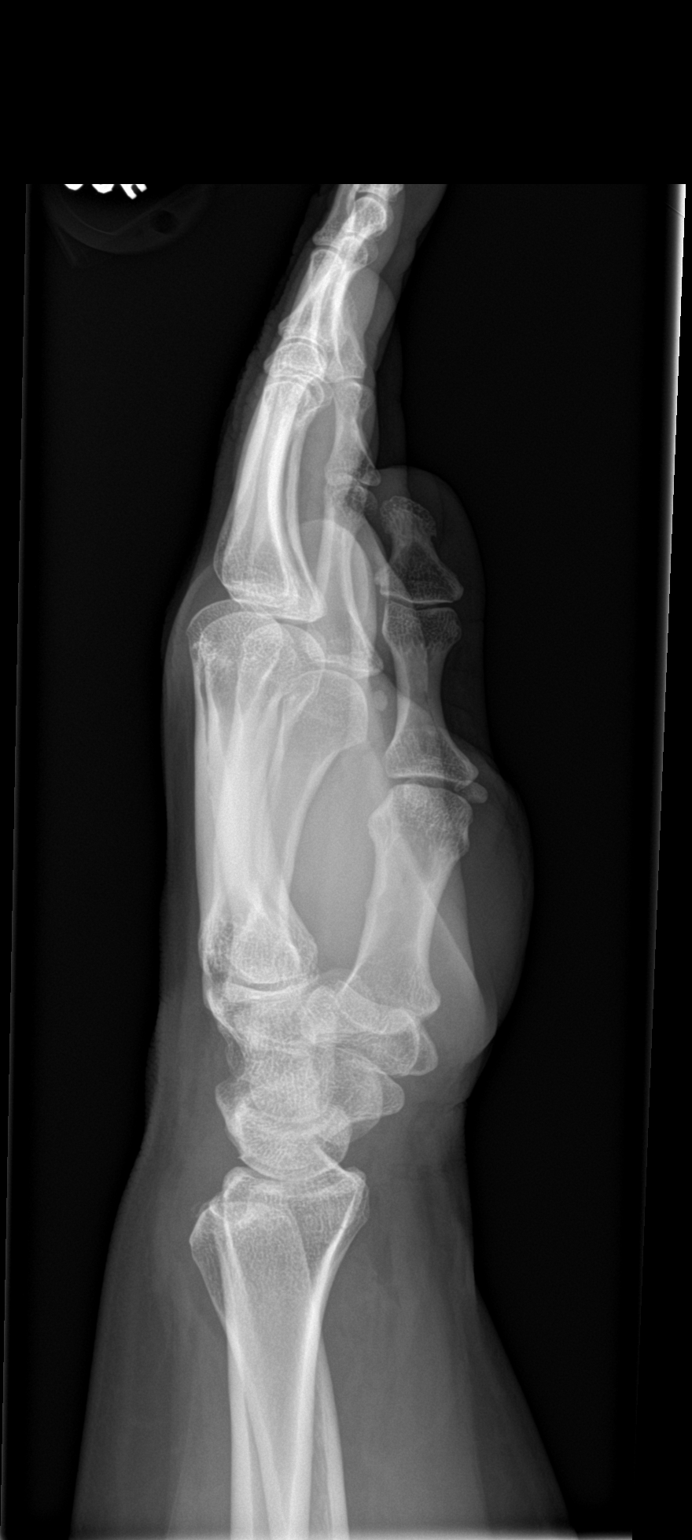
[im 3/3]
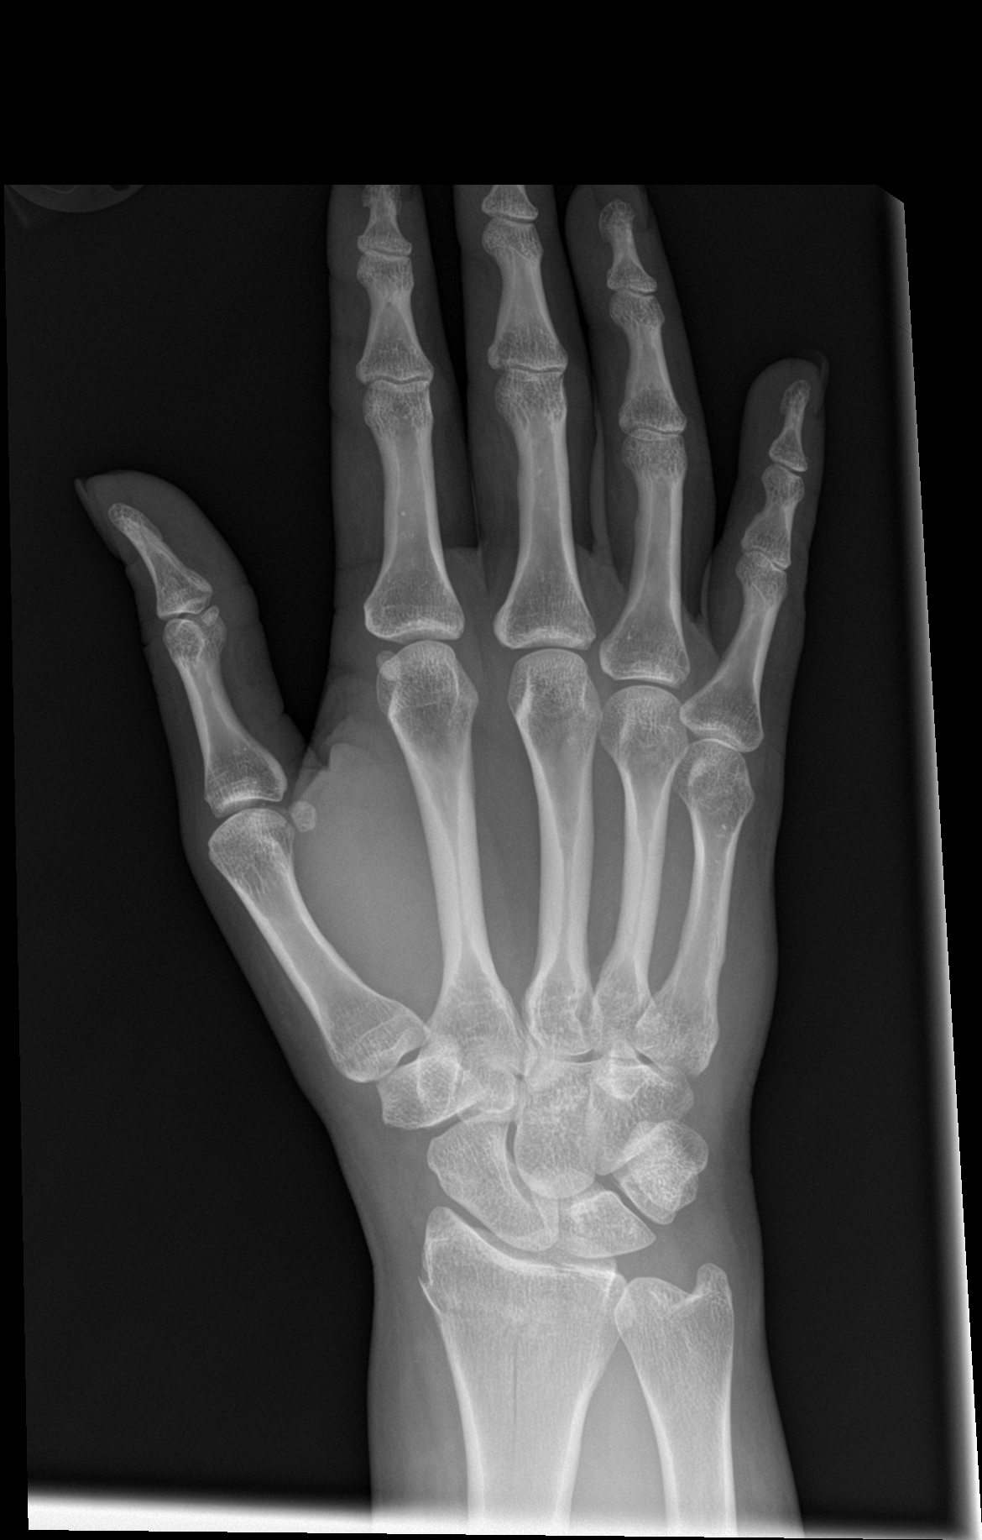

[3 of 3 positions shown; findings below may reference images not displayed]

FINDINGS: Nondisplaced intra-articular fracture of the distal radius,
extending to the distal articular surface. The distal ulna appears
intact. No carpal bone or hand fractures identified. Soft tissue
swelling noted in the distal forearm.
IMPRESSION: No acute findings seen within the hand. Nondisplaced intra-articular
fracture of the distal radius.

## 2017-12-25 IMAGING — CR DG FOREARM 2V*R*
1 series · 2 of 2 positions shown · non-contrast
Comparison: None.

CLINICAL DATA: Fall today. Bilateral wrist pain with limited range
of motion. Low back pain.

EXAM:
RIGHT FOREARM - 2 VIEW

[Series 1: dg forearm right · 0.14mm/px · 2 of 2 slices shown]
[im 1/2]
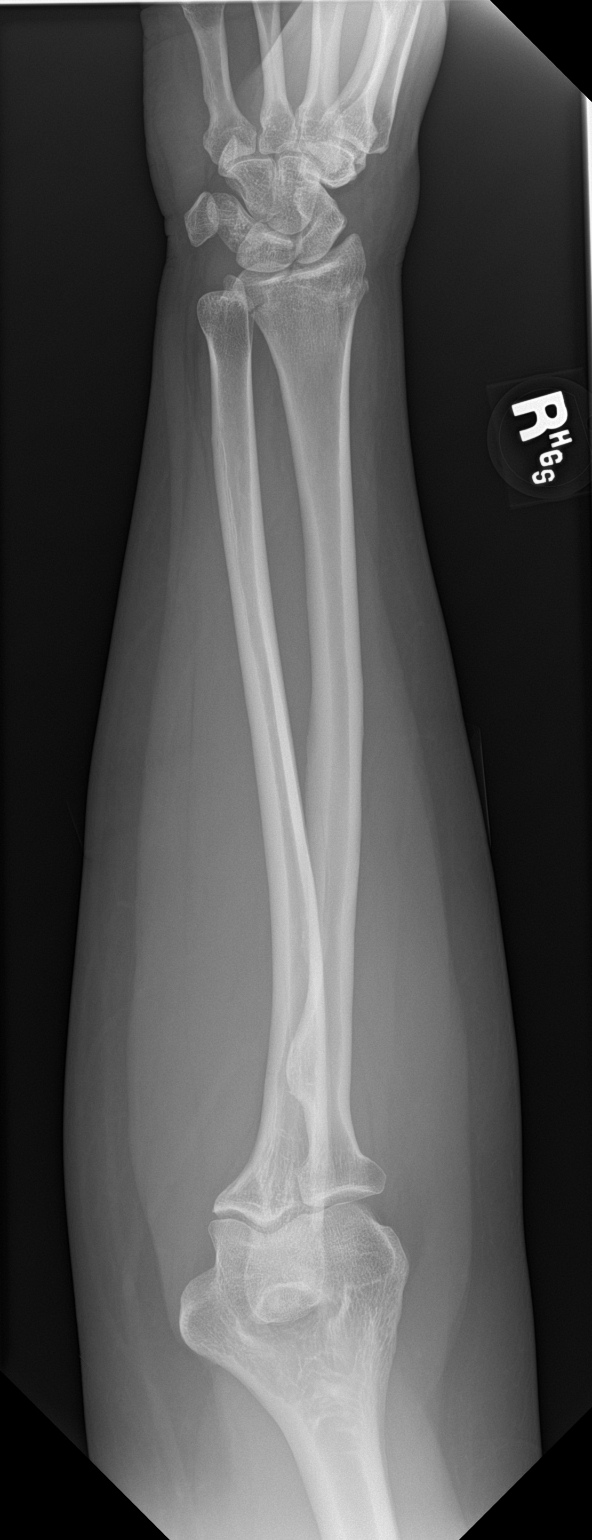
[im 2/2]
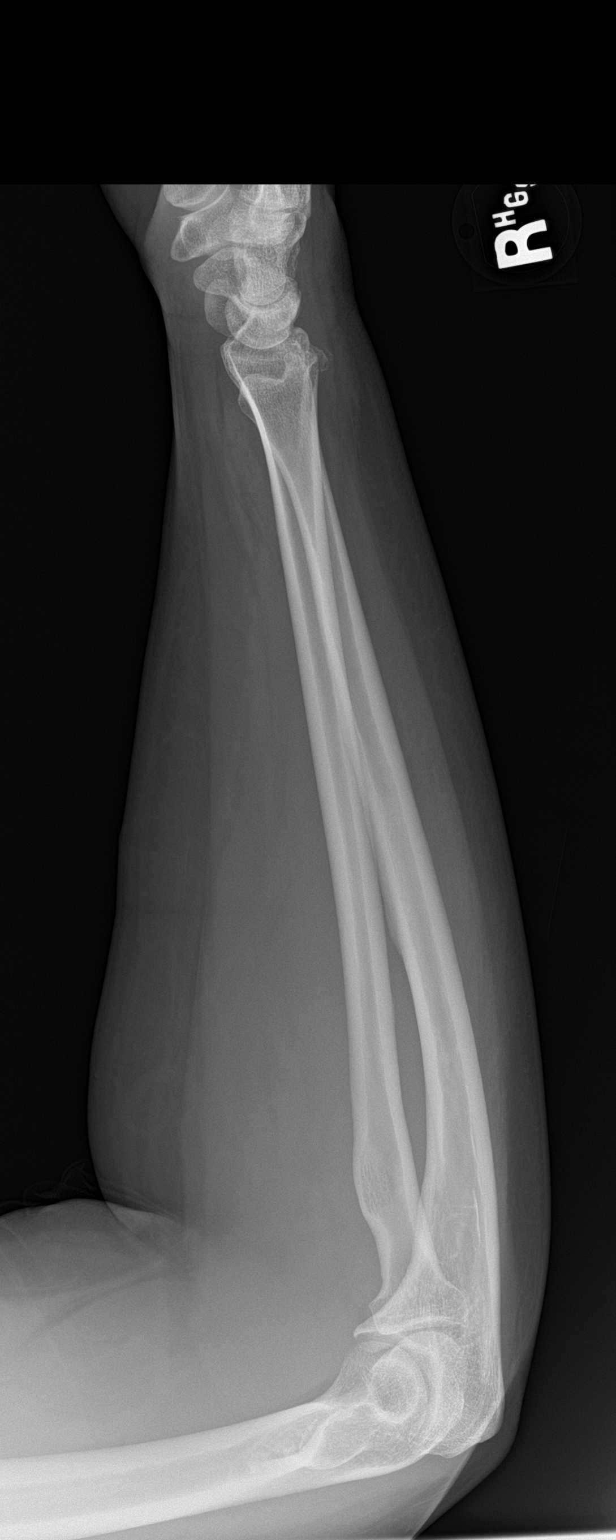

[2 of 2 positions shown; findings below may reference images not displayed]

FINDINGS: Nondisplaced intra-articular fracture of the distal radius. No
definite ulnar fracture identified. The proximal radius and ulna
appear intact. There is no dislocation or significant effusion at
the elbow. Mild distal soft tissue swelling noted.
IMPRESSION: Nondisplaced intra-articular fracture of the distal radius. No
dislocation.

## 2017-12-31 DIAGNOSIS — F419 Anxiety disorder, unspecified: Secondary | ICD-10-CM | POA: Diagnosis not present

## 2018-01-01 DIAGNOSIS — F4321 Adjustment disorder with depressed mood: Secondary | ICD-10-CM | POA: Diagnosis not present

## 2018-01-06 DIAGNOSIS — F419 Anxiety disorder, unspecified: Secondary | ICD-10-CM | POA: Diagnosis not present

## 2018-01-08 DIAGNOSIS — F4321 Adjustment disorder with depressed mood: Secondary | ICD-10-CM | POA: Diagnosis not present

## 2018-01-19 DIAGNOSIS — F4321 Adjustment disorder with depressed mood: Secondary | ICD-10-CM | POA: Diagnosis not present

## 2018-01-22 DIAGNOSIS — F419 Anxiety disorder, unspecified: Secondary | ICD-10-CM | POA: Diagnosis not present

## 2018-02-02 DIAGNOSIS — F4321 Adjustment disorder with depressed mood: Secondary | ICD-10-CM | POA: Diagnosis not present

## 2018-02-11 DIAGNOSIS — F419 Anxiety disorder, unspecified: Secondary | ICD-10-CM | POA: Diagnosis not present

## 2018-02-17 DIAGNOSIS — Z6823 Body mass index (BMI) 23.0-23.9, adult: Secondary | ICD-10-CM | POA: Diagnosis not present

## 2018-02-17 DIAGNOSIS — Z01419 Encounter for gynecological examination (general) (routine) without abnormal findings: Secondary | ICD-10-CM | POA: Diagnosis not present

## 2018-02-17 DIAGNOSIS — Z124 Encounter for screening for malignant neoplasm of cervix: Secondary | ICD-10-CM | POA: Diagnosis not present

## 2018-02-23 DIAGNOSIS — F419 Anxiety disorder, unspecified: Secondary | ICD-10-CM | POA: Diagnosis not present

## 2018-02-25 DIAGNOSIS — F4321 Adjustment disorder with depressed mood: Secondary | ICD-10-CM | POA: Diagnosis not present

## 2018-03-16 DIAGNOSIS — F419 Anxiety disorder, unspecified: Secondary | ICD-10-CM | POA: Diagnosis not present

## 2018-03-23 DIAGNOSIS — F4321 Adjustment disorder with depressed mood: Secondary | ICD-10-CM | POA: Diagnosis not present

## 2018-04-02 DIAGNOSIS — F419 Anxiety disorder, unspecified: Secondary | ICD-10-CM | POA: Diagnosis not present

## 2018-04-23 DIAGNOSIS — F419 Anxiety disorder, unspecified: Secondary | ICD-10-CM | POA: Diagnosis not present

## 2018-04-27 DIAGNOSIS — F4321 Adjustment disorder with depressed mood: Secondary | ICD-10-CM | POA: Diagnosis not present

## 2018-05-03 DIAGNOSIS — Z23 Encounter for immunization: Secondary | ICD-10-CM | POA: Diagnosis not present

## 2018-05-06 DIAGNOSIS — H66001 Acute suppurative otitis media without spontaneous rupture of ear drum, right ear: Secondary | ICD-10-CM | POA: Diagnosis not present

## 2018-05-06 DIAGNOSIS — H1089 Other conjunctivitis: Secondary | ICD-10-CM | POA: Diagnosis not present

## 2018-05-06 DIAGNOSIS — J309 Allergic rhinitis, unspecified: Secondary | ICD-10-CM | POA: Diagnosis not present

## 2018-05-25 DIAGNOSIS — F4321 Adjustment disorder with depressed mood: Secondary | ICD-10-CM | POA: Diagnosis not present

## 2018-05-31 DIAGNOSIS — F419 Anxiety disorder, unspecified: Secondary | ICD-10-CM | POA: Diagnosis not present

## 2018-06-17 ENCOUNTER — Other Ambulatory Visit: Payer: Self-pay | Admitting: Family Medicine

## 2018-06-17 DIAGNOSIS — J0140 Acute pansinusitis, unspecified: Secondary | ICD-10-CM | POA: Diagnosis not present

## 2018-06-18 NOTE — Telephone Encounter (Signed)
Noted, minute clinic note reviewed from 06/17/18, treated for sinusitis. Will send refill of Flonase.

## 2018-06-22 DIAGNOSIS — F419 Anxiety disorder, unspecified: Secondary | ICD-10-CM | POA: Diagnosis not present

## 2018-06-29 DIAGNOSIS — F4321 Adjustment disorder with depressed mood: Secondary | ICD-10-CM | POA: Diagnosis not present

## 2018-07-02 ENCOUNTER — Other Ambulatory Visit: Payer: Self-pay | Admitting: Primary Care

## 2018-07-02 MED ORDER — FLUTICASONE PROPIONATE 50 MCG/ACT NA SUSP: 1.0000 | Freq: Two times a day (BID) | NASAL | 1 refills | Status: AC

## 2018-07-02 NOTE — Addendum Note (Signed)
Addended by: Tawnya CrookSAMBATH, Raylyn Carton on: 07/02/2018 10:42 AM   Modules accepted: Orders

## 2018-07-05 DIAGNOSIS — L0390 Cellulitis, unspecified: Secondary | ICD-10-CM | POA: Diagnosis not present

## 2018-07-13 DIAGNOSIS — F419 Anxiety disorder, unspecified: Secondary | ICD-10-CM | POA: Diagnosis not present

## 2018-07-20 DIAGNOSIS — F4321 Adjustment disorder with depressed mood: Secondary | ICD-10-CM | POA: Diagnosis not present

## 2018-08-03 DIAGNOSIS — F419 Anxiety disorder, unspecified: Secondary | ICD-10-CM | POA: Diagnosis not present

## 2018-08-13 DIAGNOSIS — R6889 Other general symptoms and signs: Secondary | ICD-10-CM | POA: Diagnosis not present

## 2018-08-13 DIAGNOSIS — J101 Influenza due to other identified influenza virus with other respiratory manifestations: Secondary | ICD-10-CM | POA: Diagnosis not present

## 2018-08-19 DIAGNOSIS — F4321 Adjustment disorder with depressed mood: Secondary | ICD-10-CM | POA: Diagnosis not present

## 2018-08-27 DIAGNOSIS — F419 Anxiety disorder, unspecified: Secondary | ICD-10-CM | POA: Diagnosis not present

## 2018-09-07 DIAGNOSIS — F4321 Adjustment disorder with depressed mood: Secondary | ICD-10-CM | POA: Diagnosis not present

## 2018-09-17 DIAGNOSIS — F419 Anxiety disorder, unspecified: Secondary | ICD-10-CM | POA: Diagnosis not present

## 2018-09-28 DIAGNOSIS — F4321 Adjustment disorder with depressed mood: Secondary | ICD-10-CM | POA: Diagnosis not present

## 2018-10-08 DIAGNOSIS — F419 Anxiety disorder, unspecified: Secondary | ICD-10-CM | POA: Diagnosis not present

## 2018-10-13 DIAGNOSIS — F4321 Adjustment disorder with depressed mood: Secondary | ICD-10-CM | POA: Diagnosis not present

## 2018-11-24 DIAGNOSIS — F419 Anxiety disorder, unspecified: Secondary | ICD-10-CM | POA: Diagnosis not present

## 2018-12-21 DIAGNOSIS — F4321 Adjustment disorder with depressed mood: Secondary | ICD-10-CM | POA: Diagnosis not present

## 2018-12-22 DIAGNOSIS — F419 Anxiety disorder, unspecified: Secondary | ICD-10-CM | POA: Diagnosis not present

## 2019-01-05 DIAGNOSIS — F419 Anxiety disorder, unspecified: Secondary | ICD-10-CM | POA: Diagnosis not present

## 2019-01-18 DIAGNOSIS — F4321 Adjustment disorder with depressed mood: Secondary | ICD-10-CM | POA: Diagnosis not present

## 2019-01-20 DIAGNOSIS — F419 Anxiety disorder, unspecified: Secondary | ICD-10-CM | POA: Diagnosis not present

## 2019-02-14 DIAGNOSIS — F4321 Adjustment disorder with depressed mood: Secondary | ICD-10-CM | POA: Diagnosis not present

## 2019-02-24 DIAGNOSIS — F419 Anxiety disorder, unspecified: Secondary | ICD-10-CM | POA: Diagnosis not present

## 2019-03-14 DIAGNOSIS — F4321 Adjustment disorder with depressed mood: Secondary | ICD-10-CM | POA: Diagnosis not present

## 2019-03-16 DIAGNOSIS — F419 Anxiety disorder, unspecified: Secondary | ICD-10-CM | POA: Diagnosis not present

## 2019-04-18 DIAGNOSIS — F4321 Adjustment disorder with depressed mood: Secondary | ICD-10-CM | POA: Diagnosis not present

## 2019-04-20 DIAGNOSIS — F419 Anxiety disorder, unspecified: Secondary | ICD-10-CM | POA: Diagnosis not present

## 2019-05-18 DIAGNOSIS — F419 Anxiety disorder, unspecified: Secondary | ICD-10-CM | POA: Diagnosis not present

## 2019-05-25 DIAGNOSIS — F4321 Adjustment disorder with depressed mood: Secondary | ICD-10-CM | POA: Diagnosis not present

## 2019-06-22 DIAGNOSIS — Z7689 Persons encountering health services in other specified circumstances: Secondary | ICD-10-CM | POA: Diagnosis not present

## 2019-06-22 DIAGNOSIS — N6310 Unspecified lump in the right breast, unspecified quadrant: Secondary | ICD-10-CM | POA: Diagnosis not present

## 2019-06-22 DIAGNOSIS — H9203 Otalgia, bilateral: Secondary | ICD-10-CM | POA: Diagnosis not present

## 2019-06-22 DIAGNOSIS — Z13220 Encounter for screening for lipoid disorders: Secondary | ICD-10-CM | POA: Diagnosis not present

## 2019-07-06 DIAGNOSIS — N6310 Unspecified lump in the right breast, unspecified quadrant: Secondary | ICD-10-CM | POA: Diagnosis not present

## 2019-07-06 DIAGNOSIS — R922 Inconclusive mammogram: Secondary | ICD-10-CM | POA: Diagnosis not present

## 2019-07-06 DIAGNOSIS — N6311 Unspecified lump in the right breast, upper outer quadrant: Secondary | ICD-10-CM | POA: Diagnosis not present

## 2019-07-06 DIAGNOSIS — F4321 Adjustment disorder with depressed mood: Secondary | ICD-10-CM | POA: Diagnosis not present

## 2019-07-06 DIAGNOSIS — N6489 Other specified disorders of breast: Secondary | ICD-10-CM | POA: Diagnosis not present

## 2019-07-13 DIAGNOSIS — F419 Anxiety disorder, unspecified: Secondary | ICD-10-CM | POA: Diagnosis not present

## 2022-06-26 ENCOUNTER — Other Ambulatory Visit: Payer: Self-pay | Admitting: Family Medicine

## 2022-06-26 DIAGNOSIS — N938 Other specified abnormal uterine and vaginal bleeding: Secondary | ICD-10-CM

## 2022-06-26 DIAGNOSIS — E611 Iron deficiency: Secondary | ICD-10-CM

## 2022-07-28 ENCOUNTER — Ambulatory Visit
Admission: RE | Admit: 2022-07-28 | Discharge: 2022-07-28 | Disposition: A | Discharge status: Home / Self Care | Payer: Self-pay | Source: Ambulatory Visit | Attending: Family Medicine | Admitting: Family Medicine

## 2022-07-28 DIAGNOSIS — N938 Other specified abnormal uterine and vaginal bleeding: Secondary | ICD-10-CM

## 2022-07-28 DIAGNOSIS — E611 Iron deficiency: Secondary | ICD-10-CM

## 2024-02-26 ENCOUNTER — Other Ambulatory Visit: Payer: Self-pay | Admitting: Obstetrics and Gynecology

## 2024-02-29 LAB — SURGICAL PATHOLOGY
